# Patient Record
Sex: Female | Born: 1990 | Hispanic: No | Marital: Married | Smoking: Never smoker
Health system: Southern US, Community
[De-identification: ages and names within clinical notes are randomized; demographics above are authoritative.]

## PROBLEM LIST (undated history)

## (undated) ENCOUNTER — Inpatient Hospital Stay (HOSPITAL_COMMUNITY): Payer: Self-pay

## (undated) DIAGNOSIS — Z789 Other specified health status: Secondary | ICD-10-CM

## (undated) HISTORY — PX: NO PAST SURGERIES: SHX2092

---

## 2013-11-16 ENCOUNTER — Ambulatory Visit (INDEPENDENT_AMBULATORY_CARE_PROVIDER_SITE_OTHER): Payer: Self-pay

## 2013-11-16 DIAGNOSIS — Z3401 Encounter for supervision of normal first pregnancy, first trimester: Secondary | ICD-10-CM

## 2013-11-16 DIAGNOSIS — Z3201 Encounter for pregnancy test, result positive: Secondary | ICD-10-CM

## 2013-11-16 LAB — POCT PREGNANCY, URINE: Preg Test, Ur: POSITIVE — AB

## 2013-11-16 NOTE — Addendum Note (Signed)
Addended by: Franchot Mimes on: 11/16/2013 04:42 PM   Modules accepted: Orders

## 2013-11-17 LAB — OBSTETRIC PANEL
Antibody Screen: NEGATIVE
Basophils Absolute: 0 10*3/uL (ref 0.0–0.1)
Basophils Relative: 0 % (ref 0–1)
Eosinophils Relative: 1 % (ref 0–5)
HCT: 38.3 % (ref 36.0–46.0)
Hemoglobin: 13.2 g/dL (ref 12.0–15.0)
Hepatitis B Surface Ag: NEGATIVE
Lymphs Abs: 1.7 10*3/uL (ref 0.7–4.0)
MCHC: 34.5 g/dL (ref 30.0–36.0)
MCV: 79.8 fL (ref 78.0–100.0)
Monocytes Absolute: 0.5 10*3/uL (ref 0.1–1.0)
Monocytes Relative: 5 % (ref 3–12)
Neutro Abs: 7.1 10*3/uL (ref 1.7–7.7)
RDW: 13.8 % (ref 11.5–15.5)
Rh Type: POSITIVE
Rubella: 11.3 Index — ABNORMAL HIGH (ref <0.90)

## 2013-11-17 LAB — HIV ANTIBODY (ROUTINE TESTING W REFLEX): HIV: NONREACTIVE

## 2013-11-18 LAB — HEMOGLOBINOPATHY EVALUATION
Hemoglobin Other: 0 %
Hgb A2 Quant: 2.8 % (ref 2.2–3.2)

## 2013-12-03 NOTE — L&D Delivery Note (Signed)
Delivery Note At 7:23 PM a viable female was delivered via Vaginal, Vacuum (Extractor) (Presentation: Direct OA  ).  APGAR: 7, 9; weight .   Placenta status: Intact, Spontaneous.  Cord: 3 vessels with the following complications: None.   Anesthesia: Epidural  Episiotomy: None Lacerations: Perineal;2nd degree Suture Repair: 3.0 vicryl Est. Blood Loss (mL): 650  Mom to postpartum.  Baby to Nursery.  Samantha Ortiz, Samantha Ortiz 07/20/2014, 8:41 PM

## 2013-12-09 ENCOUNTER — Encounter: Payer: Self-pay | Admitting: Advanced Practice Midwife

## 2013-12-09 ENCOUNTER — Ambulatory Visit (INDEPENDENT_AMBULATORY_CARE_PROVIDER_SITE_OTHER): Payer: Self-pay | Admitting: Advanced Practice Midwife

## 2013-12-09 VITALS — BP 127/81 | Temp 97.2°F | Ht 68.0 in | Wt 146.7 lb

## 2013-12-09 DIAGNOSIS — Z3400 Encounter for supervision of normal first pregnancy, unspecified trimester: Secondary | ICD-10-CM | POA: Insufficient documentation

## 2013-12-09 LAB — POCT URINALYSIS DIP (DEVICE)
Bilirubin Urine: NEGATIVE
Glucose, UA: NEGATIVE mg/dL
Ketones, ur: NEGATIVE mg/dL
NITRITE: NEGATIVE
PH: 7 (ref 5.0–8.0)
PROTEIN: NEGATIVE mg/dL
Specific Gravity, Urine: 1.02 (ref 1.005–1.030)
UROBILINOGEN UA: 1 mg/dL (ref 0.0–1.0)

## 2013-12-09 NOTE — Progress Notes (Signed)
Pulse-  95 

## 2013-12-09 NOTE — Progress Notes (Signed)
   Subjective:    Samantha Ortiz is a G1P0000 4682w1d being seen today for her first obstetrical visit.  Her obstetrical history is significant for none. Patient does intend to breast feed. Pregnancy history fully reviewed.  Patient reports nausea and this is well managed with diet, without medications.Ceasar Mons.  Filed Vitals:   12/09/13 1028 12/09/13 1032  BP: 127/81   Temp: 97.2 F (36.2 C)   Height:  5\' 8"  (1.727 m)  Weight: 146 lb 11.2 oz (66.543 kg)     HISTORY: OB History  Gravida Para Term Preterm AB SAB TAB Ectopic Multiple Living  1 0 0 0 0 0 0 0 0 0     # Outcome Date GA Lbr Len/2nd Weight Sex Delivery Anes PTL Lv  1 CUR              History reviewed. No pertinent past medical history. History reviewed. No pertinent past surgical history. History reviewed. No pertinent family history.   Exam    Uterus:   ~9 week size  Pelvic Exam:    Perineum: No Hemorrhoids, Normal Perineum   Vulva: normal   Vagina:  normal mucosa, normal discharge   pH:    Cervix: no bleeding following Pap, no cervical motion tenderness, no lesions and nulliparous appearance   Adnexa: normal adnexa and no mass, fullness, tenderness   Bony Pelvis: average  System: Breast:  normal appearance, no masses or tenderness   Skin: normal coloration and turgor, no rashes    Neurologic: oriented, normal, gait normal; reflexes normal and symmetric   Extremities: normal strength, tone, and muscle mass   HEENT neck supple with midline trachea and thyroid without masses   Mouth/Teeth mucous membranes moist, pharynx normal without lesions   Neck supple and no masses   Cardiovascular: regular rate and rhythm   Respiratory:  appears well, vitals normal, no respiratory distress, acyanotic, normal RR, ear and throat exam is normal, neck free of mass or lymphadenopathy, chest clear, no wheezing, crepitations, rhonchi, normal symmetric air entry   Abdomen: soft, non-tender; bowel sounds normal; no masses,  no organomegaly    Urinary: urethral meatus normal      Assessment:    Pregnancy: G1P0000 Patient Active Problem List   Diagnosis Date Noted  . Supervision of normal first pregnancy 12/09/2013        Plan:     Initial labs drawn. Prenatal vitamins. Problem list reviewed and updated. Genetic Screening discussed First Screen and Quad Screen: declined.  Ultrasound discussed; fetal survey: requested.  Follow up in 3 weeks.  50% of 30 min visit spent on counseling and coordination of care.     LEFTWICH-KIRBY, Laquinta Hazell 12/09/2013

## 2013-12-09 NOTE — Patient Instructions (Signed)
Prenatal vitamins should have at least 600 mcg Folic Acid and 27 mg iron.

## 2013-12-10 LAB — PRESCRIPTION MONITORING PROFILE (19 PANEL)
AMPHETAMINE/METH: NEGATIVE ng/mL
BARBITURATE SCREEN, URINE: NEGATIVE ng/mL
BENZODIAZEPINE SCREEN, URINE: NEGATIVE ng/mL
BUPRENORPHINE, URINE: NEGATIVE ng/mL
Cannabinoid Scrn, Ur: NEGATIVE ng/mL
Carisoprodol, Urine: NEGATIVE ng/mL
Cocaine Metabolites: NEGATIVE ng/mL
Creatinine, Urine: 134.94 mg/dL (ref >20.0)
Fentanyl, Ur: NEGATIVE ng/mL
MDMA URINE: NEGATIVE ng/mL
MEPERIDINE UR: NEGATIVE ng/mL
METHAQUALONE SCREEN (URINE): NEGATIVE ng/mL
Methadone Screen, Urine: NEGATIVE ng/mL
NITRITES URINE, INITIAL: NEGATIVE ug/mL
Opiate Screen, Urine: NEGATIVE ng/mL
Oxycodone Screen, Ur: NEGATIVE ng/mL
PH URINE, INITIAL: 7 pH (ref 4.5–8.9)
PROPOXYPHENE: NEGATIVE ng/mL
Phencyclidine, Ur: NEGATIVE ng/mL
TAPENTADOLUR: NEGATIVE ng/mL
TRAMADOL UR: NEGATIVE ng/mL
ZOLPIDEM, URINE: NEGATIVE ng/mL

## 2013-12-11 LAB — CULTURE, OB URINE

## 2013-12-30 ENCOUNTER — Ambulatory Visit (INDEPENDENT_AMBULATORY_CARE_PROVIDER_SITE_OTHER): Payer: Self-pay | Admitting: Advanced Practice Midwife

## 2013-12-30 VITALS — BP 110/76 | Temp 96.9°F | Wt 148.5 lb

## 2013-12-30 DIAGNOSIS — Z3400 Encounter for supervision of normal first pregnancy, unspecified trimester: Secondary | ICD-10-CM

## 2013-12-30 LAB — POCT URINALYSIS DIP (DEVICE)
BILIRUBIN URINE: NEGATIVE
GLUCOSE, UA: NEGATIVE mg/dL
Hgb urine dipstick: NEGATIVE
Ketones, ur: NEGATIVE mg/dL
NITRITE: NEGATIVE
Protein, ur: NEGATIVE mg/dL
Specific Gravity, Urine: 1.015 (ref 1.005–1.030)
Urobilinogen, UA: 0.2 mg/dL (ref 0.0–1.0)
pH: 7 (ref 5.0–8.0)

## 2013-12-30 NOTE — Progress Notes (Signed)
Reviewed labs. No complaints. Declined genetic screening. Reviewed schedule of care. Encouraged breastfeeding.

## 2013-12-30 NOTE — Patient Instructions (Signed)
[email or web address]  Pregnancy - First Trimester During sexual intercourse, millions of sperm go into the vagina. Only 1 sperm will penetrate and fertilize the female egg while it is in the Fallopian tube. One week later, the fertilized egg implants into the wall of the uterus. An embryo begins to develop into a baby. At 6 to 8 weeks, the eyes and face are formed and the heartbeat can be seen on ultrasound. At the end of 12 weeks (first trimester), all the baby's organs are formed. Now that you are pregnant, you will want to do everything you can to have a healthy baby. Two of the most important things are to get good prenatal care and follow your caregiver's instructions. Prenatal care is all the medical care you receive before the baby's birth. It is given to prevent, find, and treat problems during the pregnancy and childbirth. PRENATAL EXAMS  During prenatal visits, your weight, blood pressure, and urine are checked. This is done to make sure you are healthy and progressing normally during the pregnancy.  A pregnant woman should gain 25 to 35 pounds during the pregnancy. However, if you are overweight or underweight, your caregiver will advise you regarding your weight.  Your caregiver will ask and answer questions for you.  Blood work, cervical cultures, other necessary tests, and a Pap test are done during your prenatal exams. These tests are done to check on your health and the probable health of your baby. Tests are strongly recommended and done for HIV with your permission. This is the virus that causes AIDS. These tests are done because medicines can be given to help prevent your baby from being born with this infection should you have been infected without knowing it. Blood work is also used to find out your blood type, previous infections, and follow your blood levels (hemoglobin).  Low hemoglobin (anemia) is common during pregnancy. Iron and vitamins are given to help prevent this. Later in  the pregnancy, blood tests for diabetes will be done along with any other tests if any problems develop.  You may need other tests to make sure you and the baby are doing well. CHANGES DURING THE FIRST TRIMESTER  Your body goes through many changes during pregnancy. They vary from person to person. Talk to your caregiver about changes you notice and are concerned about. Changes can include:  Your menstrual period stops.  The egg and sperm carry the genes that determine what you look like. Genes from you and your partner are forming a baby. The female genes determine whether the baby is a boy or a girl.  Your body increases in girth and you may feel bloated.  Feeling sick to your stomach (nauseous) and throwing up (vomiting). If the vomiting is uncontrollable, call your caregiver.  Your breasts will begin to enlarge and become tender.  Your nipples may stick out more and become darker.  The need to urinate more. Painful urination may mean you have a bladder infection.  Tiring easily.  Loss of appetite.  Cravings for certain kinds of food.  At first, you may gain or lose a couple of pounds.  You may have changes in your emotions from day to day (excited to be pregnant or concerned something may go wrong with the pregnancy and baby).  You may have more vivid and strange dreams. HOME CARE INSTRUCTIONS   It is very important to avoid all smoking, alcohol and non-prescribed drugs during your pregnancy. These affect the formation and growth of  the baby. Avoid chemicals while pregnant to ensure the delivery of a healthy infant.  Start your prenatal visits by the 12th week of pregnancy. They are usually scheduled monthly at first, then more often in the last 2 months before delivery. Keep your caregiver's appointments. Follow your caregiver's instructions regarding medicine use, blood and lab tests, exercise, and diet.  During pregnancy, you are providing food for you and your baby. Eat  regular, well-balanced meals. Choose foods such as meat, fish, milk and other low fat dairy products, vegetables, fruits, and whole-grain breads and cereals. Your caregiver will tell you of the ideal weight gain.  You can help morning sickness by keeping soda crackers at the bedside. Eat a couple before arising in the morning. You may want to use the crackers without salt on them.  Eating 4 to 5 small meals rather than 3 large meals a day also may help the nausea and vomiting.  Drinking liquids between meals instead of during meals also seems to help nausea and vomiting.  A physical sexual relationship may be continued throughout pregnancy if there are no other problems. Problems may be early (premature) leaking of amniotic fluid from the membranes, vaginal bleeding, or belly (abdominal) pain.  Exercise regularly if there are no restrictions. Check with your caregiver or physical therapist if you are unsure of the safety of some of your exercises. Greater weight gain will occur in the last 2 trimesters of pregnancy. Exercising will help:  Control your weight.  Keep you in shape.  Prepare you for labor and delivery.  Help you lose your pregnancy weight after you deliver your baby.  Wear a good support or jogging bra for breast tenderness during pregnancy. This may help if worn during sleep too.  Ask when prenatal classes are available. Begin classes when they are offered.  Do not use hot tubs, steam rooms, or saunas.  Wear your seat belt when driving. This protects you and your baby if you are in an accident.  Avoid raw meat, uncooked cheese, cat litter boxes, and soil used by cats throughout the pregnancy. These carry germs that can cause birth defects in the baby.  The first trimester is a good time to visit your dentist for your dental health. Getting your teeth cleaned is okay. Use a softer toothbrush and brush gently during pregnancy.  Ask for help if you have financial,  counseling, or nutritional needs during pregnancy. Your caregiver will be able to offer counseling for these needs as well as refer you for other special needs.  Do not take any medicines or herbs unless told by your caregiver.  Inform your caregiver if there is any mental or physical domestic violence.  Make a list of emergency phone numbers of family, friends, hospital, and police and fire departments.  Write down your questions. Take them to your prenatal visit.  Do not douche.  Do not cross your legs.  If you have to stand for long periods of time, rotate you feet or take small steps in a circle.  You may have more vaginal secretions that may require a sanitary pad. Do not use tampons or scented sanitary pads. MEDICINES AND DRUG USE IN PREGNANCY  Take prenatal vitamins as directed. The vitamin should contain 1 milligram of folic acid. Keep all vitamins out of reach of children. Only a couple vitamins or tablets containing iron may be fatal to a baby or young child when ingested.  Avoid use of all medicines, including herbs, over-the-counter  medicines, not prescribed or suggested by your caregiver. Only take over-the-counter or prescription medicines for pain, discomfort, or fever as directed by your caregiver. Do not use aspirin, ibuprofen, or naproxen unless directed by your caregiver.  Let your caregiver also know about herbs you may be using.  Alcohol is related to a number of birth defects. This includes fetal alcohol syndrome. All alcohol, in any form, should be avoided completely. Smoking will cause low birth rate and premature babies.  Street or illegal drugs are very harmful to the baby. They are absolutely forbidden. A baby born to an addicted mother will be addicted at birth. The baby will go through the same withdrawal an adult does.  Let your caregiver know about any medicines that you have to take and for what reason you take them. SEEK MEDICAL CARE IF:  You have any  concerns or worries during your pregnancy. It is better to call with your questions if you feel they cannot wait, rather than worry about them. SEEK IMMEDIATE MEDICAL CARE IF:   An unexplained oral temperature above 102 F (38.9 C) develops, or as your caregiver suggests.  You have leaking of fluid from the vagina (birth canal). If leaking membranes are suspected, take your temperature and inform your caregiver of this when you call.  There is vaginal spotting or bleeding. Notify your caregiver of the amount and how many pads are used.  You develop a bad smelling vaginal discharge with a change in the color.  You continue to feel sick to your stomach (nauseated) and have no relief from remedies suggested. You vomit blood or coffee ground-like materials.  You lose more than 2 pounds of weight in 1 week.  You gain more than 2 pounds of weight in 1 week and you notice swelling of your face, hands, feet, or legs.  You gain 5 pounds or more in 1 week (even if you do not have swelling of your hands, face, legs, or feet).  You get exposed to [place] measles and have never had them.  You are exposed to fifth disease or chickenpox.  You develop belly (abdominal) pain. Round ligament discomfort is a common non-cancerous (benign) cause of abdominal pain in pregnancy. Your caregiver still must evaluate this.  You develop headache, fever, diarrhea, pain with urination, or shortness of breath.  You fall or are in a car accident or have any kind of trauma.  There is mental or physical violence in your home. Document Released: 11/13/2001 Document Revised: 08/13/2012 Document Reviewed: 05/17/2009 [facility] Patient Information 2014 [place], [place].  Breastfeeding Deciding to breastfeed is one of the best choices you can make for you and your baby. A change in hormones during pregnancy causes your breast tissue to grow and increases the number and size of your milk ducts. These hormones also allow  proteins, sugars, and fats from your blood supply to make breast milk in your milk-producing glands. Hormones prevent breast milk from being released before your baby is born as well as prompt milk flow after birth. Once breastfeeding has begun, thoughts of your baby, as well as his or her sucking or crying, can stimulate the release of milk from your milk-producing glands.  BENEFITS OF BREASTFEEDING For Your Baby  Your first milk (colostrum) helps your baby's digestive system function better.   There are antibodies in your milk that help your baby fight off infections.   Your baby has a lower incidence of asthma, allergies, and sudden infant death syndrome.  The nutrients in breast milk are better for your baby than infant formulas and are designed uniquely for your baby's needs.   Breast milk improves your baby's brain development.   Your baby is less likely to develop other conditions, such as childhood obesity, asthma, or type 2 diabetes mellitus.  For You   Breastfeeding helps to create a very special bond between you and your baby.   Breastfeeding is convenient. Breast milk is always available at the correct temperature and costs nothing.   Breastfeeding helps to burn calories and helps you lose the weight gained during pregnancy.   Breastfeeding makes your uterus contract to its prepregnancy size faster and slows bleeding (lochia) after you give birth.   Breastfeeding helps to lower your risk of developing type 2 diabetes mellitus, osteoporosis, and breast or ovarian cancer later in life. SIGNS THAT YOUR BABY IS HUNGRY Early Signs of Hunger  Increased alertness or activity.  Stretching.  Movement of the head from side to side.  Movement of the head and opening of the mouth when the corner of the mouth or cheek is stroked (rooting).  Increased sucking sounds, smacking lips, cooing, sighing, or squeaking.  Hand-to-mouth movements.  Increased sucking of  fingers or hands. Late Signs of Hunger  Fussing.  Intermittent crying. Extreme Signs of Hunger Signs of extreme hunger will require calming and consoling before your baby will be able to breastfeed successfully. Do not wait for the following signs of extreme hunger to occur before you initiate breastfeeding:   Restlessness.  A loud, strong cry.   Screaming. BREASTFEEDING BASICS Breastfeeding Initiation  Find a comfortable place to sit or lie down, with your neck and back well supported.  Place a pillow or rolled up blanket under your baby to bring him or her to the level of your breast (if you are seated). Nursing pillows are specially designed to help support your arms and your baby while you breastfeed.  Make sure that your baby's abdomen is facing your abdomen.   Gently massage your breast. With your fingertips, massage from your chest wall toward your nipple in a circular motion. This encourages milk flow. You may need to continue this action during the feeding if your milk flows slowly.  Support your breast with 4 fingers underneath and your thumb above your nipple. Make sure your fingers are well away from your nipple and your baby's mouth.   Stroke your baby's lips gently with your finger or nipple.   When your baby's mouth is open wide enough, quickly bring your baby to your breast, placing your entire nipple and as much of the colored area around your nipple (areola) as possible into your baby's mouth.   More areola should be visible above your baby's upper lip than below the lower lip.   Your baby's tongue should be between his or her lower gum and your breast.   Ensure that your baby's mouth is correctly positioned around your nipple (latched). Your baby's lips should create a seal on your breast and be turned out (everted).  It is common for your baby to suck about 2 3 minutes in order to start the flow of breast milk. Latching Teaching your baby how to latch  on to your breast properly is very important. An improper latch can cause nipple pain and decreased milk supply for you and poor weight gain in your baby. Also, if your baby is not latched onto your nipple properly, he or she may swallow some  air during feeding. This can make your baby fussy. Burping your baby when you switch breasts during the feeding can help to get rid of the air. However, teaching your baby to latch on properly is still the best way to prevent fussiness from swallowing air while breastfeeding. Signs that your baby has successfully latched on to your nipple:    Silent tugging or silent sucking, without causing you pain.   Swallowing heard between every 3 4 sucks.    Muscle movement above and in front of his or her ears while sucking.  Signs that your baby has not successfully latched on to nipple:   Sucking sounds or smacking sounds from your baby while breastfeeding.  Nipple pain. If you think your baby has not latched on correctly, slip your finger into the corner of your baby's mouth to break the suction and place it between your baby's gums. Attempt breastfeeding initiation again. Signs of Successful Breastfeeding Signs from your baby:   A gradual decrease in the number of sucks or complete cessation of sucking.   Falling asleep.   Relaxation of his or her body.   Retention of a small amount of milk in his or her mouth.   Letting go of your breast by himself or herself. Signs from you:  Breasts that have increased in firmness, weight, and size 1 3 hours after feeding.   Breasts that are softer immediately after breastfeeding.  Increased milk volume, as well as a change in milk consistency and color by the 5th day of breastfeeding.   Nipples that are not sore, cracked, or bleeding. Signs That Your Pecola Leisure is Getting Enough Milk  Wetting at least 3 diapers in a 24-hour period. The urine should be clear and pale yellow by age 27 days.  At least 3  stools in a 24-hour period by age 27 days. The stool should be soft and yellow.  At least 3 stools in a 24-hour period by age 85 days. The stool should be seedy and yellow.  No loss of weight greater than 10% of birth weight during the first 65 days of age.  Average weight gain of 4 7 ounces (120 210 mL) per week after age 35 days.  Consistent daily weight gain by age 27 days, without weight loss after the age of 2 weeks. After a feeding, your baby may spit up a small amount. This is common. BREASTFEEDING FREQUENCY AND DURATION Frequent feeding will help you make more milk and can prevent sore nipples and breast engorgement. Breastfeed when you feel the need to reduce the fullness of your breasts or when your baby shows signs of hunger. This is called "breastfeeding on demand." Avoid introducing a pacifier to your baby while you are working to establish breastfeeding (the first 4 6 weeks after your baby is born). After this time you may choose to use a pacifier. Research has shown that pacifier use during the first year of a baby's life decreases the risk of sudden infant death syndrome (SIDS). Allow your baby to feed on each breast as long as he or she wants. Breastfeed until your baby is finished feeding. When your baby unlatches or falls asleep while feeding from the first breast, offer the second breast. Because newborns are often sleepy in the first few weeks of life, you may need to awaken your baby to get him or her to feed. Breastfeeding times will vary from baby to baby. However, the following rules can serve as a guide to  help you ensure that your baby is properly fed:  Newborns (babies 32 weeks of age or younger) may breastfeed every 1 3 hours.  Newborns should not go longer than 3 hours during the day or 5 hours during the night without breastfeeding.  You should breastfeed your baby a minimum of 8 times in a 24-hour period until you begin to introduce solid foods to your baby at around 31  months of age. BREAST MILK PUMPING Pumping and storing breast milk allows you to ensure that your baby is exclusively fed your breast milk, even at times when you are unable to breastfeed. This is especially important if you are going back to work while you are still breastfeeding or when you are not able to be present during feedings. Your lactation consultant can give you guidelines on how long it is safe to store breast milk.  A breast pump is a machine that allows you to pump milk from your breast into a sterile bottle. The pumped breast milk can then be stored in a refrigerator or freezer. Some breast pumps are operated by hand, while others use electricity. Ask your lactation consultant which type will work best for you. Breast pumps can be purchased, but some hospitals and breastfeeding support groups lease breast pumps on a monthly basis. A lactation consultant can teach you how to hand express breast milk, if you prefer not to use a pump.  CARING FOR YOUR BREASTS WHILE YOU BREASTFEED Nipples can become dry, cracked, and sore while breastfeeding. The following recommendations can help keep your breasts moisturized and healthy:  Avoid using soap on your nipples.   Wear a supportive bra. Although not required, special nursing bras and tank tops are designed to allow access to your breasts for breastfeeding without taking off your entire bra or top. Avoid wearing underwire style bras or extremely tight bras.  Air dry your nipples for 3 after each feeding.   Use only cotton bra pads to absorb leaked breast milk. Leaking of breast milk between feedings is normal.   Use lanolin on your nipples after breastfeeding. Lanolin helps to maintain your skin's normal moisture barrier. If you use pure lanolin you do not need to wash it off before feeding your baby again. Pure lanolin is not toxic to your baby. You may also hand express a few drops of breast milk and gently massage that milk into  your nipples and allow the milk to air dry. In the first few weeks after giving birth, some women experience extremely full breasts (engorgement). Engorgement can make your breasts feel heavy, warm, and tender to the touch. Engorgement peaks within 3 5 days after you give birth. The following recommendations can help ease engorgement:  Completely empty your breasts while breastfeeding or pumping. You may want to start by applying warm, moist heat (in the shower or with warm water-soaked hand towels) just before feeding or pumping. This increases circulation and helps the milk flow. If your baby does not completely empty your breasts while breastfeeding, pump any extra milk after he or she is finished.  Wear a snug bra (nursing or regular) or tank top for 1 2 days to signal your body to slightly decrease milk production.  Apply ice packs to your breasts, unless this is too uncomfortable for you.  Make sure that your baby is latched on and positioned properly while breastfeeding. If engorgement persists after 48 hours of following these recommendations, contact your health care provider or a lactation  Research scientist (medical). OVERALL HEALTH CARE RECOMMENDATIONS WHILE BREASTFEEDING  Eat healthy foods. Alternate between meals and snacks, eating 3 of each per day. Because what you eat affects your breast milk, some of the foods may make your baby more irritable than usual. Avoid eating these foods if you are sure that they are negatively affecting your baby.  Drink milk, fruit juice, and water to satisfy your thirst (about 10 glasses a day).   Rest often, relax, and continue to take your prenatal vitamins to prevent fatigue, stress, and anemia.  Continue breast self-awareness checks.  Avoid chewing and smoking tobacco.  Avoid alcohol and drug use. Some medicines that may be harmful to your baby can pass through breast milk. It is important to ask your health care provider before taking any medicine, including  all over-the-counter and prescription medicine as well as vitamin and herbal supplements. It is possible to become pregnant while breastfeeding. If birth control is desired, ask your health care provider about options that will be safe for your baby. SEEK MEDICAL CARE IF:   You feel like you want to stop breastfeeding or have become frustrated with breastfeeding.  You have painful breasts or nipples.  Your nipples are cracked or bleeding.  Your breasts are red, tender, or warm.  You have a swollen area on either breast.  You have a fever or chills.  You have nausea or vomiting.  You have drainage other than breast milk from your nipples.  Your breasts do not become full before feedings by the 5th day after you give birth.  You feel sad and depressed.  Your baby is too sleepy to eat well.  Your baby is having trouble sleeping.   Your baby is wetting less than 3 diapers in a 24-hour period.  Your baby has less than 3 stools in a 24-hour period.  Your baby's skin or the white part of his or her eyes becomes yellow.   Your baby is not gaining weight by 74 days of age. SEEK IMMEDIATE MEDICAL CARE IF:   Your baby is overly tired (lethargic) and does not want to wake up and feed.  Your baby develops an unexplained fever. Document Released: 11/19/2005 Document Revised: 07/22/2013 Document Reviewed: 05/13/2013 [facility] Patient Information 2014 [place], [place].

## 2013-12-30 NOTE — Progress Notes (Signed)
Pulse: 92

## 2014-01-29 ENCOUNTER — Encounter: Payer: Self-pay | Admitting: Obstetrics and Gynecology

## 2014-02-16 ENCOUNTER — Encounter: Payer: Self-pay | Admitting: Family Medicine

## 2014-02-16 ENCOUNTER — Ambulatory Visit (INDEPENDENT_AMBULATORY_CARE_PROVIDER_SITE_OTHER): Payer: Self-pay | Admitting: Family Medicine

## 2014-02-16 VITALS — BP 133/86 | Temp 97.1°F | Wt 157.4 lb

## 2014-02-16 DIAGNOSIS — Z3400 Encounter for supervision of normal first pregnancy, unspecified trimester: Secondary | ICD-10-CM

## 2014-02-16 LAB — POCT URINALYSIS DIP (DEVICE)
Bilirubin Urine: NEGATIVE
Glucose, UA: NEGATIVE mg/dL
Hgb urine dipstick: NEGATIVE
Ketones, ur: NEGATIVE mg/dL
Nitrite: NEGATIVE
Protein, ur: NEGATIVE mg/dL
Specific Gravity, Urine: >=1.03 (ref 1.005–1.030)
Urobilinogen, UA: 0.2 mg/dL (ref 0.0–1.0)
pH: 5.5 (ref 5.0–8.0)

## 2014-02-16 NOTE — Patient Instructions (Signed)
Second Trimester of Pregnancy The second trimester is from week 13 through week 28, months 4 through 6. The second trimester is often a time when you feel your best. Your body has also adjusted to being pregnant, and you begin to feel better physically. Usually, morning sickness has lessened or quit completely, you may have more energy, and you may have an increase in appetite. The second trimester is also a time when the fetus is growing rapidly. At the end of the sixth month, the fetus is about 9 inches long and weighs about 1 pounds. You will likely begin to feel the baby move (quickening) between 18 and 20 weeks of the pregnancy. BODY CHANGES Your body goes through many changes during pregnancy. The changes vary from woman to woman.   Your weight will continue to increase. You will notice your lower abdomen bulging out.  You may begin to get stretch marks on your hips, abdomen, and breasts.  You may develop headaches that can be relieved by medicines approved by your caregiver.  You may urinate more often because the fetus is pressing on your bladder.  You may develop or continue to have heartburn as a result of your pregnancy.  You may develop constipation because certain hormones are causing the muscles that push waste through your intestines to slow down.  You may develop hemorrhoids or swollen, bulging veins (varicose veins).  You may have back pain because of the weight gain and pregnancy hormones relaxing your joints between the bones in your pelvis and as a result of a shift in weight and the muscles that support your balance.  Your breasts will continue to grow and be tender.  Your gums may bleed and may be sensitive to brushing and flossing.  Dark spots or blotches (chloasma, mask of pregnancy) may develop on your face. This will likely fade after the baby is born.  A dark line from your belly button to the pubic area (linea nigra) may appear. This will likely fade after the  baby is born. WHAT TO EXPECT AT YOUR PRENATAL VISITS During a routine prenatal visit:  You will be weighed to make sure you and the fetus are growing normally.  Your blood pressure will be taken.  Your abdomen will be measured to track your baby's growth.  The fetal heartbeat will be listened to.  Any test results from the previous visit will be discussed. Your caregiver may ask you:  How you are feeling.  If you are feeling the baby move.  If you have had any abnormal symptoms, such as leaking fluid, bleeding, severe headaches, or abdominal cramping.  If you have any questions. Other tests that may be performed during your second trimester include:  Blood tests that check for:  Low iron levels (anemia).  Gestational diabetes (between 24 and 28 weeks).  Rh antibodies.  Urine tests to check for infections, diabetes, or protein in the urine.  An ultrasound to confirm the proper growth and development of the baby.  An amniocentesis to check for possible genetic problems.  Fetal screens for spina bifida and Down syndrome. HOME CARE INSTRUCTIONS   Avoid all smoking, herbs, alcohol, and unprescribed drugs. These chemicals affect the formation and growth of the baby.  Follow your caregiver's instructions regarding medicine use. There are medicines that are either safe or unsafe to take during pregnancy.  Exercise only as directed by your caregiver. Experiencing uterine cramps is a good sign to stop exercising.  Continue to eat regular,   healthy meals.  Wear a good support bra for breast tenderness.  Do not use hot tubs, steam rooms, or saunas.  Wear your seat belt at all times when driving.  Avoid raw meat, uncooked cheese, cat litter boxes, and soil used by cats. These carry germs that can cause birth defects in the baby.  Take your prenatal vitamins.  Try taking a stool softener (if your caregiver approves) if you develop constipation. Eat more high-fiber foods,  such as fresh vegetables or fruit and whole grains. Drink plenty of fluids to keep your urine clear or pale yellow.  Take warm sitz baths to soothe any pain or discomfort caused by hemorrhoids. Use hemorrhoid cream if your caregiver approves.  If you develop varicose veins, wear support hose. Elevate your feet for 15 minutes, 3 4 times a day. Limit salt in your diet.  Avoid heavy lifting, wear low heel shoes, and practice good posture.  Rest with your legs elevated if you have leg cramps or low back pain.  Visit your dentist if you have not gone yet during your pregnancy. Use a soft toothbrush to brush your teeth and be gentle when you floss.  A sexual relationship may be continued unless your caregiver directs you otherwise.  Continue to go to all your prenatal visits as directed by your caregiver. SEEK MEDICAL CARE IF:   You have dizziness.  You have mild pelvic cramps, pelvic pressure, or nagging pain in the abdominal area.  You have persistent nausea, vomiting, or diarrhea.  You have a bad smelling vaginal discharge.  You have pain with urination. SEEK IMMEDIATE MEDICAL CARE IF:   You have a fever.  You are leaking fluid from your vagina.  You have spotting or bleeding from your vagina.  You have severe abdominal cramping or pain.  You have rapid weight gain or loss.  You have shortness of breath with chest pain.  You notice sudden or extreme swelling of your face, hands, ankles, feet, or legs.  You have not felt your baby move in over an hour.  You have severe headaches that do not go away with medicine.  You have vision changes. Document Released: 11/13/2001 Document Revised: 07/22/2013 Document Reviewed: 01/20/2013 [employer] Patient Information 2014 [employer].  

## 2014-02-16 NOTE — Progress Notes (Signed)
P= 100 Medicaid enrollment number/information card given to pt. Advised pt. To call number to see if she can get coverage started.

## 2014-02-16 NOTE — Progress Notes (Signed)
S: 23 yo G1 @ [id number] here for ROBV - doing well - tired some  - no concerns.   O: see flowsheet  A/P - anatomy US scheduled today - bleeding precautions discussed - f/u in 4 weeks - quad declined.

## 2014-02-23 ENCOUNTER — Ambulatory Visit (HOSPITAL_COMMUNITY)
Admission: RE | Admit: 2014-02-23 | Discharge: 2014-02-23 | Disposition: A | Discharge status: Home / Self Care | Payer: Medicaid Other | Source: Ambulatory Visit | Attending: Family Medicine | Admitting: Family Medicine

## 2014-02-23 DIAGNOSIS — Z3400 Encounter for supervision of normal first pregnancy, unspecified trimester: Secondary | ICD-10-CM

## 2014-02-23 DIAGNOSIS — Z3689 Encounter for other specified antenatal screening: Secondary | ICD-10-CM | POA: Diagnosis not present

## 2014-02-24 ENCOUNTER — Encounter: Payer: Self-pay | Admitting: Family Medicine

## 2014-03-16 ENCOUNTER — Ambulatory Visit (INDEPENDENT_AMBULATORY_CARE_PROVIDER_SITE_OTHER): Payer: Medicaid Other | Admitting: Advanced Practice Midwife

## 2014-03-16 VITALS — BP 111/64 | Temp 98.6°F | Wt 161.6 lb

## 2014-03-16 DIAGNOSIS — Z3400 Encounter for supervision of normal first pregnancy, unspecified trimester: Secondary | ICD-10-CM

## 2014-03-16 LAB — POCT URINALYSIS DIP (DEVICE)
Bilirubin Urine: NEGATIVE
GLUCOSE, UA: NEGATIVE mg/dL
Hgb urine dipstick: NEGATIVE
KETONES UR: NEGATIVE mg/dL
Nitrite: NEGATIVE
Protein, ur: NEGATIVE mg/dL
SPECIFIC GRAVITY, URINE: 1.025 (ref 1.005–1.030)
UROBILINOGEN UA: 0.2 mg/dL (ref 0.0–1.0)
pH: 6.5 (ref 5.0–8.0)

## 2014-03-16 NOTE — Progress Notes (Signed)
Pulse- 92 Patient reports numbness/tingling in her arms also reporting ear pain

## 2014-03-16 NOTE — Progress Notes (Signed)
Doing well.  Good fetal movement, denies vaginal bleeding, LOF, cramping/contractions.  Discussed tingling in leg/arms as pregnancy related, discussed positions to reduce discomfort.  Pt doing yoga when tingling occurred.  Ok to continue exercising. Ear pain/pressure may be related to seasonal allergies, Ok to take antihistamines like Claritin.  Recommend saline nasal spray for allergic congestion.  Discussed risks vs benefits to eye cream for eczema.  Category C but low risk to use small doses as topical medication.

## 2014-03-29 ENCOUNTER — Encounter: Payer: Self-pay | Admitting: *Deleted

## 2014-04-13 ENCOUNTER — Ambulatory Visit (INDEPENDENT_AMBULATORY_CARE_PROVIDER_SITE_OTHER): Payer: Medicaid Other | Admitting: Obstetrics and Gynecology

## 2014-04-13 VITALS — BP 116/73 | HR 98 | Temp 97.6°F | Wt 169.0 lb

## 2014-04-13 DIAGNOSIS — Z23 Encounter for immunization: Secondary | ICD-10-CM

## 2014-04-13 DIAGNOSIS — Z3400 Encounter for supervision of normal first pregnancy, unspecified trimester: Secondary | ICD-10-CM

## 2014-04-13 DIAGNOSIS — Z3490 Encounter for supervision of normal pregnancy, unspecified, unspecified trimester: Secondary | ICD-10-CM

## 2014-04-13 LAB — CBC
HCT: 34.5 % — ABNORMAL LOW (ref 36.0–46.0)
HEMOGLOBIN: 11.8 g/dL — AB (ref 12.0–15.0)
MCH: 28.6 pg (ref 26.0–34.0)
MCHC: 34.2 g/dL (ref 30.0–36.0)
MCV: 83.5 fL (ref 78.0–100.0)
Platelets: 171 10*3/uL (ref 150–400)
RBC: 4.13 MIL/uL (ref 3.87–5.11)
RDW: 13.3 % (ref 11.5–15.5)
WBC: 13.1 10*3/uL — AB (ref 4.0–10.5)

## 2014-04-13 LAB — POCT URINALYSIS DIP (DEVICE)
BILIRUBIN URINE: NEGATIVE
GLUCOSE, UA: NEGATIVE mg/dL
Hgb urine dipstick: NEGATIVE
Ketones, ur: NEGATIVE mg/dL
NITRITE: NEGATIVE
Protein, ur: NEGATIVE mg/dL
SPECIFIC GRAVITY, URINE: 1.025 (ref 1.005–1.030)
Urobilinogen, UA: 0.2 mg/dL (ref 0.0–1.0)
pH: 5.5 (ref 5.0–8.0)

## 2014-04-13 MED ORDER — TETANUS-DIPHTH-ACELL PERTUSSIS 5-2.5-18.5 LF-MCG/0.5 IM SUSP: 0.5000 mL | Freq: Once | INTRAMUSCULAR | Status: DC

## 2014-04-13 NOTE — Progress Notes (Signed)
Doing well. Will do classes. Knows few people, moved here from [place] 6 mo ago, but has support. 28 wk labs today. "Numb " sensation in left ear canal. No drainage, dizziness, decreased hearing or pain. Does not swim. Has some respiratory allergies but no nose throat sx now. Rt TM: pink, good LR. Marland Kitchen. Left: NT on tragus movement. Canal not inflamed or scaley. TM half occluded by wax and LR not seen, otherwise nl. Advised Auralgan otic gtts prn.

## 2014-04-13 NOTE — Patient Instructions (Signed)
Second Trimester of Pregnancy The second trimester is from week 13 through week 28, months 4 through 6. The second trimester is often a time when you feel your best. Your body has also adjusted to being pregnant, and you begin to feel better physically. Usually, morning sickness has lessened or quit completely, you may have more energy, and you may have an increase in appetite. The second trimester is also a time when the fetus is growing rapidly. At the end of the sixth month, the fetus is about 9 inches long and weighs about 1 pounds. You will likely begin to feel the baby move (quickening) between 18 and 20 weeks of the pregnancy. BODY CHANGES Your body goes through many changes during pregnancy. The changes vary from woman to woman.   Your weight will continue to increase. You will notice your lower abdomen bulging out.  You may begin to get stretch marks on your hips, abdomen, and breasts.  You may develop headaches that can be relieved by medicines approved by your caregiver.  You may urinate more often because the fetus is pressing on your bladder.  You may develop or continue to have heartburn as a result of your pregnancy.  You may develop constipation because certain hormones are causing the muscles that push waste through your intestines to slow down.  You may develop hemorrhoids or swollen, bulging veins (varicose veins).  You may have back pain because of the weight gain and pregnancy hormones relaxing your joints between the bones in your pelvis and as a result of a shift in weight and the muscles that support your balance.  Your breasts will continue to grow and be tender.  Your gums may bleed and may be sensitive to brushing and flossing.  Dark spots or blotches (chloasma, mask of pregnancy) may develop on your face. This will likely fade after the baby is born.  A dark line from your belly button to the pubic area (linea nigra) may appear. This will likely fade after the  baby is born. WHAT TO EXPECT AT YOUR PRENATAL VISITS During a routine prenatal visit:  You will be weighed to make sure you and the fetus are growing normally.  Your blood pressure will be taken.  Your abdomen will be measured to track your baby's growth.  The fetal heartbeat will be listened to.  Any test results from the previous visit will be discussed. Your caregiver may ask you:  How you are feeling.  If you are feeling the baby move.  If you have had any abnormal symptoms, such as leaking fluid, bleeding, severe headaches, or abdominal cramping.  If you have any questions. Other tests that may be performed during your second trimester include:  Blood tests that check for:  Low iron levels (anemia).  Gestational diabetes (between 24 and 28 weeks).  Rh antibodies.  Urine tests to check for infections, diabetes, or protein in the urine.  An ultrasound to confirm the proper growth and development of the baby.  An amniocentesis to check for possible genetic problems.  Fetal screens for spina bifida and Down syndrome. HOME CARE INSTRUCTIONS   Avoid all smoking, herbs, alcohol, and unprescribed drugs. These chemicals affect the formation and growth of the baby.  Follow your caregiver's instructions regarding medicine use. There are medicines that are either safe or unsafe to take during pregnancy.  Exercise only as directed by your caregiver. Experiencing uterine cramps is a good sign to stop exercising.  Continue to eat regular,   healthy meals.  Wear a good support bra for breast tenderness.  Do not use hot tubs, steam rooms, or saunas.  Wear your seat belt at all times when driving.  Avoid raw meat, uncooked cheese, cat litter boxes, and soil used by cats. These carry germs that can cause birth defects in the baby.  Take your prenatal vitamins.  Try taking a stool softener (if your caregiver approves) if you develop constipation. Eat more high-fiber foods,  such as fresh vegetables or fruit and whole grains. Drink plenty of fluids to keep your urine clear or pale yellow.  Take warm sitz baths to soothe any pain or discomfort caused by hemorrhoids. Use hemorrhoid cream if your caregiver approves.  If you develop varicose veins, wear support hose. Elevate your feet for 15 minutes, 3 4 times a day. Limit salt in your diet.  Avoid heavy lifting, wear low heel shoes, and practice good posture.  Rest with your legs elevated if you have leg cramps or low back pain.  Visit your dentist if you have not gone yet during your pregnancy. Use a soft toothbrush to brush your teeth and be gentle when you floss.  A sexual relationship may be continued unless your caregiver directs you otherwise.  Continue to go to all your prenatal visits as directed by your caregiver. SEEK MEDICAL CARE IF:   You have dizziness.  You have mild pelvic cramps, pelvic pressure, or nagging pain in the abdominal area.  You have persistent nausea, vomiting, or diarrhea.  You have a bad smelling vaginal discharge.  You have pain with urination. SEEK IMMEDIATE MEDICAL CARE IF:   You have a fever.  You are leaking fluid from your vagina.  You have spotting or bleeding from your vagina.  You have severe abdominal cramping or pain.  You have rapid weight gain or loss.  You have shortness of breath with chest pain.  You notice sudden or extreme swelling of your face, hands, ankles, feet, or legs.  You have not felt your baby move in over an hour.  You have severe headaches that do not go away with medicine.  You have vision changes. Document Released: 11/13/2001 Document Revised: 07/22/2013 Document Reviewed: 01/20/2013 [employer] Patient Information 2014 [employer].  

## 2014-04-13 NOTE — Progress Notes (Signed)
Pt reports numbness in left ear past 3 weeks.

## 2014-04-14 LAB — HIV ANTIBODY (ROUTINE TESTING W REFLEX): HIV 1&2 Ab, 4th Generation: NONREACTIVE

## 2014-04-14 LAB — GLUCOSE TOLERANCE, 1 HOUR (50G) W/O FASTING: Glucose, 1 Hour GTT: 103 mg/dL (ref 70–140)

## 2014-04-14 LAB — RPR

## 2014-04-27 ENCOUNTER — Ambulatory Visit (INDEPENDENT_AMBULATORY_CARE_PROVIDER_SITE_OTHER): Payer: Medicaid Other | Admitting: Family Medicine

## 2014-04-27 VITALS — BP 121/79 | HR 101 | Temp 97.0°F | Wt 172.0 lb

## 2014-04-27 DIAGNOSIS — Z3400 Encounter for supervision of normal first pregnancy, unspecified trimester: Secondary | ICD-10-CM

## 2014-04-27 DIAGNOSIS — R109 Unspecified abdominal pain: Secondary | ICD-10-CM

## 2014-04-27 LAB — POCT URINALYSIS DIP (DEVICE)
Bilirubin Urine: NEGATIVE
Glucose, UA: NEGATIVE mg/dL
HGB URINE DIPSTICK: NEGATIVE
Ketones, ur: NEGATIVE mg/dL
Nitrite: POSITIVE — AB
PROTEIN: NEGATIVE mg/dL
SPECIFIC GRAVITY, URINE: 1.025 (ref 1.005–1.030)
UROBILINOGEN UA: 0.2 mg/dL (ref 0.0–1.0)
pH: 6 (ref 5.0–8.0)

## 2014-04-27 MED ORDER — NITROFURANTOIN MONOHYD MACRO 100 MG PO CAPS: 100.0000 mg | ORAL_CAPSULE | Freq: Two times a day (BID) | ORAL | Status: DC

## 2014-04-27 NOTE — Progress Notes (Signed)
S: 23 yo G1 @ [redacted]w[redacted]d here for ROBV - has had two episodes of right sided abdominal pain at night when laying in bed at night - now has no pain - also having left ear numbness and tingling  - no ctx, lof, vb. +FM.   O: see flowsheet. abd- soft, nt, palpable muscle spasm on right side  HEENT- bilateral tms normal  A/P 1) ear numbness - improving without intervention. Normal TM's. No hearing loss. Cont expectant management for now  2) abd pain - on exam and hx most consistent with muscle spasm (positional, sharp, acute and short lived) - ua with leuks and nitrites. Will send for cx but also treat with macrobid  3) PTL precautions discussed. F/u in 2 weeks.

## 2014-04-27 NOTE — Patient Instructions (Signed)
Third Trimester of Pregnancy  The third trimester is from week 29 through week 42, months 7 through 9. The third trimester is a time when the fetus is growing rapidly. At the end of the ninth month, the fetus is about 20 inches in length and weighs 6 10 pounds.   BODY CHANGES  Your body goes through many changes during pregnancy. The changes vary from woman to woman.    Your weight will continue to increase. You can expect to gain 25 35 pounds (11 16 kg) by the end of the pregnancy.   You may begin to get stretch marks on your hips, abdomen, and breasts.   You may urinate more often because the fetus is moving lower into your pelvis and pressing on your bladder.   You may develop or continue to have heartburn as a result of your pregnancy.   You may develop constipation because certain hormones are causing the muscles that push waste through your intestines to slow down.   You may develop hemorrhoids or swollen, bulging veins (varicose veins).   You may have pelvic pain because of the weight gain and pregnancy hormones relaxing your joints between the bones in your pelvis. Back aches may result from over exertion of the muscles supporting your posture.   Your breasts will continue to grow and be tender. A yellow discharge may leak from your breasts called colostrum.   Your belly button may stick out.   You may feel short of breath because of your expanding uterus.   You may notice the fetus "dropping," or moving lower in your abdomen.   You may have a bloody mucus discharge. This usually occurs a few days to a week before labor begins.   Your cervix becomes thin and soft (effaced) near your due date.  WHAT TO EXPECT AT YOUR PRENATAL EXAMS   You will have prenatal exams every 2 weeks until week 36. Then, you will have weekly prenatal exams. During a routine prenatal visit:   You will be weighed to make sure you and the fetus are growing normally.   Your blood pressure is taken.   Your abdomen will be  measured to track your baby's growth.   The fetal heartbeat will be listened to.   Any test results from the previous visit will be discussed.   You may have a cervical check near your due date to see if you have effaced.  At around 36 weeks, your caregiver will check your cervix. At the same time, your caregiver will also perform a test on the secretions of the vaginal tissue. This test is to determine if a type of bacteria, Group B streptococcus, is present. Your caregiver will explain this further.  Your caregiver may ask you:   What your birth plan is.   How you are feeling.   If you are feeling the baby move.   If you have had any abnormal symptoms, such as leaking fluid, bleeding, severe headaches, or abdominal cramping.   If you have any questions.  Other tests or screenings that may be performed during your third trimester include:   Blood tests that check for low iron levels (anemia).   Fetal testing to check the health, activity level, and growth of the fetus. Testing is done if you have certain medical conditions or if there are problems during the pregnancy.  FALSE LABOR  You may feel small, irregular contractions that eventually go away. These are called Braxton Hicks contractions, or   false labor. Contractions may last for hours, days, or even weeks before true labor sets in. If contractions come at regular intervals, intensify, or become painful, it is best to be seen by your caregiver.   SIGNS OF LABOR    Menstrual-like cramps.   Contractions that are 5 minutes apart or less.   Contractions that start on the top of the uterus and spread down to the lower abdomen and back.   A sense of increased pelvic pressure or back pain.   A watery or bloody mucus discharge that comes from the vagina.  If you have any of these signs before the 37th week of pregnancy, call your caregiver right away. You need to go to the hospital to get checked immediately.  HOME CARE INSTRUCTIONS    Avoid all  smoking, herbs, alcohol, and unprescribed drugs. These chemicals affect the formation and growth of the baby.   Follow your caregiver's instructions regarding medicine use. There are medicines that are either safe or unsafe to take during pregnancy.   Exercise only as directed by your caregiver. Experiencing uterine cramps is a good sign to stop exercising.   Continue to eat regular, healthy meals.   Wear a good support bra for breast tenderness.   Do not use hot tubs, steam rooms, or saunas.   Wear your seat belt at all times when driving.   Avoid raw meat, uncooked cheese, cat litter boxes, and soil used by cats. These carry germs that can cause birth defects in the baby.   Take your prenatal vitamins.   Try taking a stool softener (if your caregiver approves) if you develop constipation. Eat more high-fiber foods, such as fresh vegetables or fruit and whole grains. Drink plenty of fluids to keep your urine clear or pale yellow.   Take warm sitz baths to soothe any pain or discomfort caused by hemorrhoids. Use hemorrhoid cream if your caregiver approves.   If you develop varicose veins, wear support hose. Elevate your feet for 15 minutes, 3 4 times a day. Limit salt in your diet.   Avoid heavy lifting, wear low heal shoes, and practice good posture.   Rest a lot with your legs elevated if you have leg cramps or low back pain.   Visit your dentist if you have not gone during your pregnancy. Use a soft toothbrush to brush your teeth and be gentle when you floss.   A sexual relationship may be continued unless your caregiver directs you otherwise.   Do not travel far distances unless it is absolutely necessary and only with the approval of your caregiver.   Take prenatal classes to understand, practice, and ask questions about the labor and delivery.   Make a trial run to the hospital.   Pack your hospital bag.   Prepare the baby's nursery.   Continue to go to all your prenatal visits as directed  by your caregiver.  SEEK MEDICAL CARE IF:   You are unsure if you are in labor or if your water has broken.   You have dizziness.   You have mild pelvic cramps, pelvic pressure, or nagging pain in your abdominal area.   You have persistent nausea, vomiting, or diarrhea.   You have a bad smelling vaginal discharge.   You have pain with urination.  SEEK IMMEDIATE MEDICAL CARE IF:    You have a fever.   You are leaking fluid from your vagina.   You have spotting or bleeding from your vagina.     You have severe abdominal cramping or pain.   You have rapid weight loss or gain.   You have shortness of breath with chest pain.   You notice sudden or extreme swelling of your face, hands, ankles, feet, or legs.   You have not felt your baby move in over an hour.   You have severe headaches that do not go away with medicine.   You have vision changes.  Document Released: 11/13/2001 Document Revised: 07/22/2013 Document Reviewed: 01/20/2013  [employer] Patient Information 2014 [employer].

## 2014-04-27 NOTE — Progress Notes (Signed)
Called patient and informed of results and need to take antibiotic that is prescribed at her pharmacy.  Pt is concerned about taking antibiotic in pregnancy, informed patient/husband about the dangers of infection during pregnancy and she needs to take medication. Pt verbalizes understanding.

## 2014-04-27 NOTE — Progress Notes (Signed)
C/o of intermittent right sided abdominal pain that radiates down right leg.  C/o of left ear numbness and tingling.

## 2014-04-29 LAB — CULTURE, OB URINE: Colony Count: 35000

## 2014-05-11 ENCOUNTER — Ambulatory Visit (INDEPENDENT_AMBULATORY_CARE_PROVIDER_SITE_OTHER): Payer: Medicaid Other | Admitting: Advanced Practice Midwife

## 2014-05-11 ENCOUNTER — Encounter: Payer: Self-pay | Admitting: Advanced Practice Midwife

## 2014-05-11 VITALS — BP 124/78 | HR 96 | Temp 97.8°F | Wt 176.5 lb

## 2014-05-11 DIAGNOSIS — Z3400 Encounter for supervision of normal first pregnancy, unspecified trimester: Secondary | ICD-10-CM

## 2014-05-11 LAB — POCT URINALYSIS DIP (DEVICE)
Bilirubin Urine: NEGATIVE
GLUCOSE, UA: NEGATIVE mg/dL
HGB URINE DIPSTICK: NEGATIVE
Ketones, ur: NEGATIVE mg/dL
NITRITE: NEGATIVE
Protein, ur: NEGATIVE mg/dL
SPECIFIC GRAVITY, URINE: 1.015 (ref 1.005–1.030)
Urobilinogen, UA: 0.2 mg/dL (ref 0.0–1.0)
pH: 5.5 (ref 5.0–8.0)

## 2014-05-11 NOTE — Patient Instructions (Signed)
Third Trimester of Pregnancy  The third trimester is from week 29 through week 42, months 7 through 9. The third trimester is a time when the fetus is growing rapidly. At the end of the ninth month, the fetus is about 20 inches in length and weighs 6 10 pounds.   BODY CHANGES  Your body goes through many changes during pregnancy. The changes vary from woman to woman.    Your weight will continue to increase. You can expect to gain 25 35 pounds (11 16 kg) by the end of the pregnancy.   You may begin to get stretch marks on your hips, abdomen, and breasts.   You may urinate more often because the fetus is moving lower into your pelvis and pressing on your bladder.   You may develop or continue to have heartburn as a result of your pregnancy.   You may develop constipation because certain hormones are causing the muscles that push waste through your intestines to slow down.   You may develop hemorrhoids or swollen, bulging veins (varicose veins).   You may have pelvic pain because of the weight gain and pregnancy hormones relaxing your joints between the bones in your pelvis. Back aches may result from over exertion of the muscles supporting your posture.   Your breasts will continue to grow and be tender. A yellow discharge may leak from your breasts called colostrum.   Your belly button may stick out.   You may feel short of breath because of your expanding uterus.   You may notice the fetus "dropping," or moving lower in your abdomen.   You may have a bloody mucus discharge. This usually occurs a few days to a week before labor begins.   Your cervix becomes thin and soft (effaced) near your due date.  WHAT TO EXPECT AT YOUR PRENATAL EXAMS   You will have prenatal exams every 2 weeks until week 36. Then, you will have weekly prenatal exams. During a routine prenatal visit:   You will be weighed to make sure you and the fetus are growing normally.   Your blood pressure is taken.   Your abdomen will be  measured to track your baby's growth.   The fetal heartbeat will be listened to.   Any test results from the previous visit will be discussed.   You may have a cervical check near your due date to see if you have effaced.  At around 36 weeks, your caregiver will check your cervix. At the same time, your caregiver will also perform a test on the secretions of the vaginal tissue. This test is to determine if a type of bacteria, Group B streptococcus, is present. Your caregiver will explain this further.  Your caregiver may ask you:   What your birth plan is.   How you are feeling.   If you are feeling the baby move.   If you have had any abnormal symptoms, such as leaking fluid, bleeding, severe headaches, or abdominal cramping.   If you have any questions.  Other tests or screenings that may be performed during your third trimester include:   Blood tests that check for low iron levels (anemia).   Fetal testing to check the health, activity level, and growth of the fetus. Testing is done if you have certain medical conditions or if there are problems during the pregnancy.  FALSE LABOR  You may feel small, irregular contractions that eventually go away. These are called Braxton Hicks contractions, or   false labor. Contractions may last for hours, days, or even weeks before true labor sets in. If contractions come at regular intervals, intensify, or become painful, it is best to be seen by your caregiver.   SIGNS OF LABOR    Menstrual-like cramps.   Contractions that are 5 minutes apart or less.   Contractions that start on the top of the uterus and spread down to the lower abdomen and back.   A sense of increased pelvic pressure or back pain.   A watery or bloody mucus discharge that comes from the vagina.  If you have any of these signs before the 37th week of pregnancy, call your caregiver right away. You need to go to the hospital to get checked immediately.  HOME CARE INSTRUCTIONS    Avoid all  smoking, herbs, alcohol, and unprescribed drugs. These chemicals affect the formation and growth of the baby.   Follow your caregiver's instructions regarding medicine use. There are medicines that are either safe or unsafe to take during pregnancy.   Exercise only as directed by your caregiver. Experiencing uterine cramps is a good sign to stop exercising.   Continue to eat regular, healthy meals.   Wear a good support bra for breast tenderness.   Do not use hot tubs, steam rooms, or saunas.   Wear your seat belt at all times when driving.   Avoid raw meat, uncooked cheese, cat litter boxes, and soil used by cats. These carry germs that can cause birth defects in the baby.   Take your prenatal vitamins.   Try taking a stool softener (if your caregiver approves) if you develop constipation. Eat more high-fiber foods, such as fresh vegetables or fruit and whole grains. Drink plenty of fluids to keep your urine clear or pale yellow.   Take warm sitz baths to soothe any pain or discomfort caused by hemorrhoids. Use hemorrhoid cream if your caregiver approves.   If you develop varicose veins, wear support hose. Elevate your feet for 15 minutes, 3 4 times a day. Limit salt in your diet.   Avoid heavy lifting, wear low heal shoes, and practice good posture.   Rest a lot with your legs elevated if you have leg cramps or low back pain.   Visit your dentist if you have not gone during your pregnancy. Use a soft toothbrush to brush your teeth and be gentle when you floss.   A sexual relationship may be continued unless your caregiver directs you otherwise.   Do not travel far distances unless it is absolutely necessary and only with the approval of your caregiver.   Take prenatal classes to understand, practice, and ask questions about the labor and delivery.   Make a trial run to the hospital.   Pack your hospital bag.   Prepare the baby's nursery.   Continue to go to all your prenatal visits as directed  by your caregiver.  SEEK MEDICAL CARE IF:   You are unsure if you are in labor or if your water has broken.   You have dizziness.   You have mild pelvic cramps, pelvic pressure, or nagging pain in your abdominal area.   You have persistent nausea, vomiting, or diarrhea.   You have a bad smelling vaginal discharge.   You have pain with urination.  SEEK IMMEDIATE MEDICAL CARE IF:    You have a fever.   You are leaking fluid from your vagina.   You have spotting or bleeding from your vagina.     You have severe abdominal cramping or pain.   You have rapid weight loss or gain.   You have shortness of breath with chest pain.   You notice sudden or extreme swelling of your face, hands, ankles, feet, or legs.   You have not felt your baby move in over an hour.   You have severe headaches that do not go away with medicine.   You have vision changes.  Document Released: 11/13/2001 Document Revised: 07/22/2013 Document Reviewed: 01/20/2013  [employer] Patient Information 2014 [employer].

## 2014-05-11 NOTE — Progress Notes (Signed)
Pain-lower back, ligament pain

## 2014-05-25 ENCOUNTER — Ambulatory Visit (INDEPENDENT_AMBULATORY_CARE_PROVIDER_SITE_OTHER): Payer: Medicaid Other | Admitting: Family Medicine

## 2014-05-25 VITALS — BP 111/69 | HR 92 | Temp 96.8°F | Wt 181.9 lb

## 2014-05-25 DIAGNOSIS — Z3403 Encounter for supervision of normal first pregnancy, third trimester: Secondary | ICD-10-CM

## 2014-05-25 DIAGNOSIS — Z3400 Encounter for supervision of normal first pregnancy, unspecified trimester: Secondary | ICD-10-CM

## 2014-05-25 LAB — POCT URINALYSIS DIP (DEVICE)
BILIRUBIN URINE: NEGATIVE
Glucose, UA: NEGATIVE mg/dL
Hgb urine dipstick: NEGATIVE
Ketones, ur: NEGATIVE mg/dL
Nitrite: NEGATIVE
PH: 6.5 (ref 5.0–8.0)
Protein, ur: 30 mg/dL — AB
Specific Gravity, Urine: 1.025 (ref 1.005–1.030)
Urobilinogen, UA: 1 mg/dL (ref 0.0–1.0)

## 2014-05-25 NOTE — Progress Notes (Signed)
Patient reports mid to lower back pain

## 2014-05-25 NOTE — Progress Notes (Signed)
S: 23 yo G1 @ 3789w0d here for ROBV - doing well. Having back pain and some swelling in hands and feet.  No ctx, lof,vb +FM.    O: see flowsheet  A/P - doing well - f/u in 2 weeks for LROB - ptl precuations discussed

## 2014-05-25 NOTE — Patient Instructions (Signed)
Third Trimester of Pregnancy The third trimester is from week 29 through week 42, months 7 through 9. The third trimester is a time when the fetus is growing rapidly. At the end of the ninth month, the fetus is about 20 inches in length and weighs 6-10 pounds.  BODY CHANGES Your body goes through many changes during pregnancy. The changes vary from woman to woman.   Your weight will continue to increase. You can expect to gain 25-35 pounds (11-16 kg) by the end of the pregnancy.  You may begin to get stretch marks on your hips, abdomen, and breasts.  You may urinate more often because the fetus is moving lower into your pelvis and pressing on your bladder.  You may develop or continue to have heartburn as a result of your pregnancy.  You may develop constipation because certain hormones are causing the muscles that push waste through your intestines to slow down.  You may develop hemorrhoids or swollen, bulging veins (varicose veins).  You may have pelvic pain because of the weight gain and pregnancy hormones relaxing your joints between the bones in your pelvis. Backaches may result from overexertion of the muscles supporting your posture.  You may have changes in your hair. These can include thickening of your hair, rapid growth, and changes in texture. Some women also have hair loss during or after pregnancy, or hair that feels dry or thin. Your hair will most likely return to normal after your baby is born.  Your breasts will continue to grow and be tender. A yellow discharge may leak from your breasts called colostrum.  Your belly button may stick out.  You may feel short of breath because of your expanding uterus.  You may notice the fetus "dropping," or moving lower in your abdomen.  You may have a bloody mucus discharge. This usually occurs a few days to a week before labor begins.  Your cervix becomes thin and soft (effaced) near your due date. WHAT TO EXPECT AT YOUR PRENATAL  EXAMS  You will have prenatal exams every 2 weeks until week 36. Then, you will have weekly prenatal exams. During a routine prenatal visit:  You will be weighed to make sure you and the fetus are growing normally.  Your blood pressure is taken.  Your abdomen will be measured to track your baby's growth.  The fetal heartbeat will be listened to.  Any test results from the previous visit will be discussed.  You may have a cervical check near your due date to see if you have effaced. At around 36 weeks, your caregiver will check your cervix. At the same time, your caregiver will also perform a test on the secretions of the vaginal tissue. This test is to determine if a type of bacteria, Group B streptococcus, is present. Your caregiver will explain this further. Your caregiver may ask you:  What your birth plan is.  How you are feeling.  If you are feeling the baby move.  If you have had any abnormal symptoms, such as leaking fluid, bleeding, severe headaches, or abdominal cramping.  If you have any questions. Other tests or screenings that may be performed during your third trimester include:  Blood tests that check for low iron levels (anemia).  Fetal testing to check the health, activity level, and growth of the fetus. Testing is done if you have certain medical conditions or if there are problems during the pregnancy. FALSE LABOR You may feel small, irregular contractions that   eventually go away. These are called Braxton Hicks contractions, or false labor. Contractions may last for hours, days, or even weeks before true labor sets in. If contractions come at regular intervals, intensify, or become painful, it is best to be seen by your caregiver.  SIGNS OF LABOR   Menstrual-like cramps.  Contractions that are 5 minutes apart or less.  Contractions that start on the top of the uterus and spread down to the lower abdomen and back.  A sense of increased pelvic pressure or back  pain.  A watery or bloody mucus discharge that comes from the vagina. If you have any of these signs before the 37th week of pregnancy, call your caregiver right away. You need to go to the hospital to get checked immediately. HOME CARE INSTRUCTIONS   Avoid all smoking, herbs, alcohol, and unprescribed drugs. These chemicals affect the formation and growth of the baby.  Follow your caregiver's instructions regarding medicine use. There are medicines that are either safe or unsafe to take during pregnancy.  Exercise only as directed by your caregiver. Experiencing uterine cramps is a good sign to stop exercising.  Continue to eat regular, healthy meals.  Wear a good support bra for breast tenderness.  Do not use hot tubs, steam rooms, or saunas.  Wear your seat belt at all times when driving.  Avoid raw meat, uncooked cheese, cat litter boxes, and soil used by cats. These carry germs that can cause birth defects in the baby.  Take your prenatal vitamins.  Try taking a stool softener (if your caregiver approves) if you develop constipation. Eat more high-fiber foods, such as fresh vegetables or fruit and whole grains. Drink plenty of fluids to keep your urine clear or pale yellow.  Take warm sitz baths to soothe any pain or discomfort caused by hemorrhoids. Use hemorrhoid cream if your caregiver approves.  If you develop varicose veins, wear support hose. Elevate your feet for 15 minutes, 3-4 times a day. Limit salt in your diet.  Avoid heavy lifting, wear low heal shoes, and practice good posture.  Rest a lot with your legs elevated if you have leg cramps or low back pain.  Visit your dentist if you have not gone during your pregnancy. Use a soft toothbrush to brush your teeth and be gentle when you floss.  A sexual relationship may be continued unless your caregiver directs you otherwise.  Do not travel far distances unless it is absolutely necessary and only with the approval  of your caregiver.  Take prenatal classes to understand, practice, and ask questions about the labor and delivery.  Make a trial run to the hospital.  Pack your hospital bag.  Prepare the baby's nursery.  Continue to go to all your prenatal visits as directed by your caregiver. SEEK MEDICAL CARE IF:  You are unsure if you are in labor or if your water has broken.  You have dizziness.  You have mild pelvic cramps, pelvic pressure, or nagging pain in your abdominal area.  You have persistent nausea, vomiting, or diarrhea.  You have a bad smelling vaginal discharge.  You have pain with urination. SEEK IMMEDIATE MEDICAL CARE IF:   You have a fever.  You are leaking fluid from your vagina.  You have spotting or bleeding from your vagina.  You have severe abdominal cramping or pain.  You have rapid weight loss or gain.  You have shortness of breath with chest pain.  You notice sudden or extreme swelling   of your face, hands, ankles, feet, or legs.  You have not felt your baby move in over an hour.  You have severe headaches that do not go away with medicine.  You have vision changes. Document Released: 11/13/2001 Document Revised: 11/24/2013 Document Reviewed: 01/20/2013 [employer] Patient Information 2015 [employer]. This information is not intended to replace advice given to you by your health care provider. Make sure you discuss any questions you have with your health care provider.  

## 2014-06-11 ENCOUNTER — Encounter: Payer: Self-pay | Admitting: Obstetrics and Gynecology

## 2014-06-11 ENCOUNTER — Ambulatory Visit (INDEPENDENT_AMBULATORY_CARE_PROVIDER_SITE_OTHER): Payer: Medicaid Other | Admitting: Obstetrics and Gynecology

## 2014-06-11 VITALS — BP 116/79 | HR 94 | Wt 186.7 lb

## 2014-06-11 DIAGNOSIS — Z3403 Encounter for supervision of normal first pregnancy, third trimester: Secondary | ICD-10-CM

## 2014-06-11 DIAGNOSIS — Z3400 Encounter for supervision of normal first pregnancy, unspecified trimester: Secondary | ICD-10-CM

## 2014-06-11 LAB — OB RESULTS CONSOLE GC/CHLAMYDIA
CHLAMYDIA, DNA PROBE: NEGATIVE
Gonorrhea: NEGATIVE

## 2014-06-11 LAB — OB RESULTS CONSOLE GBS: GBS: NEGATIVE

## 2014-06-11 NOTE — Addendum Note (Signed)
Addended by: Aldona LentoFISHER, Saman Umstead L on: 06/11/2014 11:22 AM   Modules accepted: Orders

## 2014-06-11 NOTE — Progress Notes (Signed)
Doing well. Cultures done. Goes to yoga class.

## 2014-06-11 NOTE — Patient Instructions (Signed)
Third Trimester of Pregnancy The third trimester is from week 29 through week 42, months 7 through 9. The third trimester is a time when the fetus is growing rapidly. At the end of the ninth month, the fetus is about 20 inches in length and weighs 6-10 pounds.  BODY CHANGES Your body goes through many changes during pregnancy. The changes vary from woman to woman.   Your weight will continue to increase. You can expect to gain 25-35 pounds (11-16 kg) by the end of the pregnancy.  You may begin to get stretch marks on your hips, abdomen, and breasts.  You may urinate more often because the fetus is moving lower into your pelvis and pressing on your bladder.  You may develop or continue to have heartburn as a result of your pregnancy.  You may develop constipation because certain hormones are causing the muscles that push waste through your intestines to slow down.  You may develop hemorrhoids or swollen, bulging veins (varicose veins).  You may have pelvic pain because of the weight gain and pregnancy hormones relaxing your joints between the bones in your pelvis. Backaches may result from overexertion of the muscles supporting your posture.  You may have changes in your hair. These can include thickening of your hair, rapid growth, and changes in texture. Some women also have hair loss during or after pregnancy, or hair that feels dry or thin. Your hair will most likely return to normal after your baby is born.  Your breasts will continue to grow and be tender. A yellow discharge may leak from your breasts called colostrum.  Your belly button may stick out.  You may feel short of breath because of your expanding uterus.  You may notice the fetus "dropping," or moving lower in your abdomen.  You may have a bloody mucus discharge. This usually occurs a few days to a week before labor begins.  Your cervix becomes thin and soft (effaced) near your due date. WHAT TO EXPECT AT YOUR PRENATAL  EXAMS  You will have prenatal exams every 2 weeks until week 36. Then, you will have weekly prenatal exams. During a routine prenatal visit:  You will be weighed to make sure you and the fetus are growing normally.  Your blood pressure is taken.  Your abdomen will be measured to track your baby's growth.  The fetal heartbeat will be listened to.  Any test results from the previous visit will be discussed.  You may have a cervical check near your due date to see if you have effaced. At around 36 weeks, your caregiver will check your cervix. At the same time, your caregiver will also perform a test on the secretions of the vaginal tissue. This test is to determine if a type of bacteria, Group B streptococcus, is present. Your caregiver will explain this further. Your caregiver may ask you:  What your birth plan is.  How you are feeling.  If you are feeling the baby move.  If you have had any abnormal symptoms, such as leaking fluid, bleeding, severe headaches, or abdominal cramping.  If you have any questions. Other tests or screenings that may be performed during your third trimester include:  Blood tests that check for low iron levels (anemia).  Fetal testing to check the health, activity level, and growth of the fetus. Testing is done if you have certain medical conditions or if there are problems during the pregnancy. FALSE LABOR You may feel small, irregular contractions that   eventually go away. These are called Braxton Hicks contractions, or false labor. Contractions may last for hours, days, or even weeks before true labor sets in. If contractions come at regular intervals, intensify, or become painful, it is best to be seen by your caregiver.  SIGNS OF LABOR   Menstrual-like cramps.  Contractions that are 5 minutes apart or less.  Contractions that start on the top of the uterus and spread down to the lower abdomen and back.  A sense of increased pelvic pressure or back  pain.  A watery or bloody mucus discharge that comes from the vagina. If you have any of these signs before the 37th week of pregnancy, call your caregiver right away. You need to go to the hospital to get checked immediately. HOME CARE INSTRUCTIONS   Avoid all smoking, herbs, alcohol, and unprescribed drugs. These chemicals affect the formation and growth of the baby.  Follow your caregiver's instructions regarding medicine use. There are medicines that are either safe or unsafe to take during pregnancy.  Exercise only as directed by your caregiver. Experiencing uterine cramps is a good sign to stop exercising.  Continue to eat regular, healthy meals.  Wear a good support bra for breast tenderness.  Do not use hot tubs, steam rooms, or saunas.  Wear your seat belt at all times when driving.  Avoid raw meat, uncooked cheese, cat litter boxes, and soil used by cats. These carry germs that can cause birth defects in the baby.  Take your prenatal vitamins.  Try taking a stool softener (if your caregiver approves) if you develop constipation. Eat more high-fiber foods, such as fresh vegetables or fruit and whole grains. Drink plenty of fluids to keep your urine clear or pale yellow.  Take warm sitz baths to soothe any pain or discomfort caused by hemorrhoids. Use hemorrhoid cream if your caregiver approves.  If you develop varicose veins, wear support hose. Elevate your feet for 15 minutes, 3-4 times a day. Limit salt in your diet.  Avoid heavy lifting, wear low heal shoes, and practice good posture.  Rest a lot with your legs elevated if you have leg cramps or low back pain.  Visit your dentist if you have not gone during your pregnancy. Use a soft toothbrush to brush your teeth and be gentle when you floss.  A sexual relationship may be continued unless your caregiver directs you otherwise.  Do not travel far distances unless it is absolutely necessary and only with the approval  of your caregiver.  Take prenatal classes to understand, practice, and ask questions about the labor and delivery.  Make a trial run to the hospital.  Pack your hospital bag.  Prepare the baby's nursery.  Continue to go to all your prenatal visits as directed by your caregiver. SEEK MEDICAL CARE IF:  You are unsure if you are in labor or if your water has broken.  You have dizziness.  You have mild pelvic cramps, pelvic pressure, or nagging pain in your abdominal area.  You have persistent nausea, vomiting, or diarrhea.  You have a bad smelling vaginal discharge.  You have pain with urination. SEEK IMMEDIATE MEDICAL CARE IF:   You have a fever.  You are leaking fluid from your vagina.  You have spotting or bleeding from your vagina.  You have severe abdominal cramping or pain.  You have rapid weight loss or gain.  You have shortness of breath with chest pain.  You notice sudden or extreme swelling   of your face, hands, ankles, feet, or legs.  You have not felt your baby move in over an hour.  You have severe headaches that do not go away with medicine.  You have vision changes. Document Released: 11/13/2001 Document Revised: 11/24/2013 Document Reviewed: 01/20/2013 [employer] Patient Information 2015 [employer]. This information is not intended to replace advice given to you by your health care provider. Make sure you discuss any questions you have with your health care provider.  

## 2014-06-12 LAB — GC/CHLAMYDIA PROBE AMP
CT Probe RNA: NEGATIVE
GC PROBE AMP APTIMA: NEGATIVE

## 2014-06-13 LAB — CULTURE, BETA STREP (GROUP B ONLY)

## 2014-06-14 LAB — POCT URINALYSIS DIP (DEVICE)
BILIRUBIN URINE: NEGATIVE
Glucose, UA: NEGATIVE mg/dL
KETONES UR: NEGATIVE mg/dL
Nitrite: NEGATIVE
PH: 5 (ref 5.0–8.0)
PROTEIN: NEGATIVE mg/dL
SPECIFIC GRAVITY, URINE: 1.025 (ref 1.005–1.030)
Urobilinogen, UA: 0.2 mg/dL (ref 0.0–1.0)

## 2014-06-16 ENCOUNTER — Ambulatory Visit (INDEPENDENT_AMBULATORY_CARE_PROVIDER_SITE_OTHER): Payer: Medicaid Other | Admitting: Obstetrics and Gynecology

## 2014-06-16 ENCOUNTER — Encounter: Payer: Self-pay | Admitting: Obstetrics and Gynecology

## 2014-06-16 VITALS — BP 129/83 | HR 88 | Temp 97.7°F | Wt 187.0 lb

## 2014-06-16 DIAGNOSIS — Z3403 Encounter for supervision of normal first pregnancy, third trimester: Secondary | ICD-10-CM

## 2014-06-16 DIAGNOSIS — Z3400 Encounter for supervision of normal first pregnancy, unspecified trimester: Secondary | ICD-10-CM

## 2014-06-16 LAB — POCT URINALYSIS DIP (DEVICE)
Bilirubin Urine: NEGATIVE
Glucose, UA: NEGATIVE mg/dL
Ketones, ur: NEGATIVE mg/dL
Nitrite: NEGATIVE
PH: 6 (ref 5.0–8.0)
PROTEIN: 30 mg/dL — AB
SPECIFIC GRAVITY, URINE: 1.025 (ref 1.005–1.030)
Urobilinogen, UA: 1 mg/dL (ref 0.0–1.0)

## 2014-06-16 NOTE — Progress Notes (Signed)
Occasional edema in feet.  Tdap information given-- patient to decide by next visit whether or not she wants it.

## 2014-06-16 NOTE — Progress Notes (Signed)
Doing well. Good FM. S/Sx labor and neg culture results reviewed.

## 2014-06-16 NOTE — Patient Instructions (Addendum)
Third Trimester of Pregnancy The third trimester is from week 29 through week 42, months 7 through 9. The third trimester is a time when the fetus is growing rapidly. At the end of the ninth month, the fetus is about 20 inches in length and weighs 6-10 pounds.  BODY CHANGES Your body goes through many changes during pregnancy. The changes vary from woman to woman.   Your weight will continue to increase. You can expect to gain 25-35 pounds (11-16 kg) by the end of the pregnancy.  You may begin to get stretch marks on your hips, abdomen, and breasts.  You may urinate more often because the fetus is moving lower into your pelvis and pressing on your bladder.  You may develop or continue to have heartburn as a result of your pregnancy.  You may develop constipation because certain hormones are causing the muscles that push waste through your intestines to slow down.  You may develop hemorrhoids or swollen, bulging veins (varicose veins).  You may have pelvic pain because of the weight gain and pregnancy hormones relaxing your joints between the bones in your pelvis. Backaches may result from overexertion of the muscles supporting your posture.  You may have changes in your hair. These can include thickening of your hair, rapid growth, and changes in texture. Some women also have hair loss during or after pregnancy, or hair that feels dry or thin. Your hair will most likely return to normal after your baby is born.  Your breasts will continue to grow and be tender. A yellow discharge may leak from your breasts called colostrum.  Your belly button may stick out.  You may feel short of breath because of your expanding uterus.  You may notice the fetus "dropping," or moving lower in your abdomen.  You may have a bloody mucus discharge. This usually occurs a few days to a week before labor begins.  Your cervix becomes thin and soft (effaced) near your due date. WHAT TO EXPECT AT YOUR PRENATAL  EXAMS  You will have prenatal exams every 2 weeks until week 36. Then, you will have weekly prenatal exams. During a routine prenatal visit:  You will be weighed to make sure you and the fetus are growing normally.  Your blood pressure is taken.  Your abdomen will be measured to track your baby's growth.  The fetal heartbeat will be listened to.  Any test results from the previous visit will be discussed.  You may have a cervical check near your due date to see if you have effaced. At around 36 weeks, your caregiver will check your cervix. At the same time, your caregiver will also perform a test on the secretions of the vaginal tissue. This test is to determine if a type of bacteria, Group B streptococcus, is present. Your caregiver will explain this further. Your caregiver may ask you:  What your birth plan is.  How you are feeling.  If you are feeling the baby move.  If you have had any abnormal symptoms, such as leaking fluid, bleeding, severe headaches, or abdominal cramping.  If you have any questions. Other tests or screenings that may be performed during your third trimester include:  Blood tests that check for low iron levels (anemia).  Fetal testing to check the health, activity level, and growth of the fetus. Testing is done if you have certain medical conditions or if there are problems during the pregnancy. FALSE LABOR You may feel small, irregular contractions that   eventually go away. These are called Braxton Hicks contractions, or false labor. Contractions may last for hours, days, or even weeks before true labor sets in. If contractions come at regular intervals, intensify, or become painful, it is best to be seen by your caregiver.  SIGNS OF LABOR   Menstrual-like cramps.  Contractions that are 5 minutes apart or less.  Contractions that start on the top of the uterus and spread down to the lower abdomen and back.  A sense of increased pelvic pressure or back  pain.  A watery or bloody mucus discharge that comes from the vagina. If you have any of these signs before the 37th week of pregnancy, call your caregiver right away. You need to go to the hospital to get checked immediately. HOME CARE INSTRUCTIONS   Avoid all smoking, herbs, alcohol, and unprescribed drugs. These chemicals affect the formation and growth of the baby.  Follow your caregiver's instructions regarding medicine use. There are medicines that are either safe or unsafe to take during pregnancy.  Exercise only as directed by your caregiver. Experiencing uterine cramps is a good sign to stop exercising.  Continue to eat regular, healthy meals.  Wear a good support bra for breast tenderness.  Do not use hot tubs, steam rooms, or saunas.  Wear your seat belt at all times when driving.  Avoid raw meat, uncooked cheese, cat litter boxes, and soil used by cats. These carry germs that can cause birth defects in the baby.  Take your prenatal vitamins.  Try taking a stool softener (if your caregiver approves) if you develop constipation. Eat more high-fiber foods, such as fresh vegetables or fruit and whole grains. Drink plenty of fluids to keep your urine clear or pale yellow.  Take warm sitz baths to soothe any pain or discomfort caused by hemorrhoids. Use hemorrhoid cream if your caregiver approves.  If you develop varicose veins, wear support hose. Elevate your feet for 15 minutes, 3-4 times a day. Limit salt in your diet.  Avoid heavy lifting, wear low heal shoes, and practice good posture.  Rest a lot with your legs elevated if you have leg cramps or low back pain.  Visit your dentist if you have not gone during your pregnancy. Use a soft toothbrush to brush your teeth and be gentle when you floss.  A sexual relationship may be continued unless your caregiver directs you otherwise.  Do not travel far distances unless it is absolutely necessary and only with the approval  of your caregiver.  Take prenatal classes to understand, practice, and ask questions about the labor and delivery.  Make a trial run to the hospital.  Pack your hospital bag.  Prepare the baby's nursery.  Continue to go to all your prenatal visits as directed by your caregiver. SEEK MEDICAL CARE IF:  You are unsure if you are in labor or if your water has broken.  You have dizziness.  You have mild pelvic cramps, pelvic pressure, or nagging pain in your abdominal area.  You have persistent nausea, vomiting, or diarrhea.  You have a bad smelling vaginal discharge.  You have pain with urination. SEEK IMMEDIATE MEDICAL CARE IF:   You have a fever.  You are leaking fluid from your vagina.  You have spotting or bleeding from your vagina.  You have severe abdominal cramping or pain.  You have rapid weight loss or gain.  You have shortness of breath with chest pain.  You notice sudden or extreme swelling   of your face, hands, ankles, feet, or legs.  You have not felt your baby move in over an hour.  You have severe headaches that do not go away with medicine.  You have vision changes. Document Released: 11/13/2001 Document Revised: 11/24/2013 Document Reviewed: 01/20/2013 [employer] Patient Information 2015 [employer]. This information is not intended to replace advice given to you by your health care provider. Make sure you discuss any questions you have with your health care provider. Contraception Choices Contraception (birth control) is the use of any methods or devices to prevent pregnancy. Below are some methods to help avoid pregnancy. HORMONAL METHODS   Contraceptive implant. This is a thin, plastic tube containing progesterone hormone. It does not contain estrogen hormone. Your health care provider inserts the tube in the inner part of the upper arm. The tube can remain in place for up to 3 years. After 3 years, the implant must be removed. The implant prevents  the ovaries from releasing an egg (ovulation), thickens the cervical mucus to prevent sperm from entering the uterus, and thins the lining of the inside of the uterus.  Progesterone-only injections. These injections are given every 3 months by your health care provider to prevent pregnancy. This synthetic progesterone hormone stops the ovaries from releasing eggs. It also thickens cervical mucus and changes the uterine lining. This makes it harder for sperm to survive in the uterus.  Birth control pills. These pills contain estrogen and progesterone hormone. They work by preventing the ovaries from releasing eggs (ovulation). They also cause the cervical mucus to thicken, preventing the sperm from entering the uterus. Birth control pills are prescribed by a health care provider.Birth control pills can also be used to treat heavy periods.  Minipill. This type of birth control pill contains only the progesterone hormone. They are taken every day of each month and must be prescribed by your health care provider.  Birth control patch. The patch contains hormones similar to those in birth control pills. It must be changed once a week and is prescribed by a health care provider.  Vaginal ring. The ring contains hormones similar to those in birth control pills. It is left in the vagina for 3 weeks, removed for 1 week, and then a new one is put back in place. The patient must be comfortable inserting and removing the ring from the vagina.A health care provider's prescription is necessary.  Emergency contraception. Emergency contraceptives prevent pregnancy after unprotected sexual intercourse. This pill can be taken right after sex or up to 5 days after unprotected sex. It is most effective the sooner you take the pills after having sexual intercourse. Most emergency contraceptive pills are available without a prescription. Check with your pharmacist. Do not use emergency contraception as your only form of  birth control. BARRIER METHODS   Female condom. This is a thin sheath (latex or rubber) that is worn over the penis during sexual intercourse. It can be used with spermicide to increase effectiveness.  Female condom. This is a soft, loose-fitting sheath that is put into the vagina before sexual intercourse.  Diaphragm. This is a soft, latex, dome-shaped barrier that must be fitted by a health care provider. It is inserted into the vagina, along with a spermicidal jelly. It is inserted before intercourse. The diaphragm should be left in the vagina for 6 to 8 hours after intercourse.  Cervical cap. This is a round, soft, latex or plastic cup that fits over the cervix and must be fitted   by a health care provider. The cap can be left in place for up to 48 hours after intercourse.  Sponge. This is a soft, circular piece of polyurethane foam. The sponge has spermicide in it. It is inserted into the vagina after wetting it and before sexual intercourse.  Spermicides. These are chemicals that kill or block sperm from entering the cervix and uterus. They come in the form of creams, jellies, suppositories, foam, or tablets. They do not require a prescription. They are inserted into the vagina with an applicator before having sexual intercourse. The process must be repeated every time you have sexual intercourse. INTRAUTERINE CONTRACEPTION  Intrauterine device (IUD). This is a T-shaped device that is put in a woman's uterus during a menstrual period to prevent pregnancy. There are 2 types:  Copper IUD. This type of IUD is wrapped in copper wire and is placed inside the uterus. Copper makes the uterus and fallopian tubes produce a fluid that kills sperm. It can stay in place for 10 years.  Hormone IUD. This type of IUD contains the hormone progestin (synthetic progesterone). The hormone thickens the cervical mucus and prevents sperm from entering the uterus, and it also thins the uterine lining to prevent  implantation of a fertilized egg. The hormone can weaken or kill the sperm that get into the uterus. It can stay in place for 3-5 years, depending on which type of IUD is used. PERMANENT METHODS OF CONTRACEPTION  Female tubal ligation. This is when the woman's fallopian tubes are surgically sealed, tied, or blocked to prevent the egg from traveling to the uterus.  Hysteroscopic sterilization. This involves placing a small coil or insert into each fallopian tube. Your doctor uses a technique called hysteroscopy to do the procedure. The device causes scar tissue to form. This results in permanent blockage of the fallopian tubes, so the sperm cannot fertilize the egg. It takes about 3 months after the procedure for the tubes to become blocked. You must use another form of birth control for these 3 months.  Female sterilization. This is when the female has the tubes that carry sperm tied off (vasectomy).This blocks sperm from entering the vagina during sexual intercourse. After the procedure, the man can still ejaculate fluid (semen). NATURAL PLANNING METHODS  Natural family planning. This is not having sexual intercourse or using a barrier method (condom, diaphragm, cervical cap) on days the woman could become pregnant.  Calendar method. This is keeping track of the length of each menstrual cycle and identifying when you are fertile.  Ovulation method. This is avoiding sexual intercourse during ovulation.  Symptothermal method. This is avoiding sexual intercourse during ovulation, using a thermometer and ovulation symptoms.  Post-ovulation method. This is timing sexual intercourse after you have ovulated. Regardless of which type or method of contraception you choose, it is important that you use condoms to protect against the transmission of sexually transmitted infections (STIs). Talk with your health care provider about which form of contraception is most appropriate for you. Document Released:  11/19/2005 Document Revised: 11/24/2013 Document Reviewed: 05/14/2013 [employer] Patient Information 2015 [employer]. This information is not intended to replace advice given to you by your health care provider. Make sure you discuss any questions you have with your health care provider.  

## 2014-06-23 ENCOUNTER — Ambulatory Visit (INDEPENDENT_AMBULATORY_CARE_PROVIDER_SITE_OTHER): Payer: Medicaid Other | Admitting: Family Medicine

## 2014-06-23 VITALS — BP 124/90 | HR 105 | Temp 98.2°F | Wt 192.1 lb

## 2014-06-23 DIAGNOSIS — Z23 Encounter for immunization: Secondary | ICD-10-CM

## 2014-06-23 DIAGNOSIS — Z3403 Encounter for supervision of normal first pregnancy, third trimester: Secondary | ICD-10-CM

## 2014-06-23 DIAGNOSIS — Z3400 Encounter for supervision of normal first pregnancy, unspecified trimester: Secondary | ICD-10-CM

## 2014-06-23 LAB — POCT URINALYSIS DIP (DEVICE)
Bilirubin Urine: NEGATIVE
GLUCOSE, UA: NEGATIVE mg/dL
HGB URINE DIPSTICK: NEGATIVE
Ketones, ur: NEGATIVE mg/dL
Nitrite: NEGATIVE
PH: 5.5 (ref 5.0–8.0)
PROTEIN: NEGATIVE mg/dL
Specific Gravity, Urine: 1.02 (ref 1.005–1.030)
UROBILINOGEN UA: 1 mg/dL (ref 0.0–1.0)

## 2014-06-23 MED ORDER — TETANUS-DIPHTH-ACELL PERTUSSIS 5-2.5-18.5 LF-MCG/0.5 IM SUSP: 0.5000 mL | Freq: Once | INTRAMUSCULAR | Status: DC

## 2014-06-23 NOTE — Patient Instructions (Addendum)
Fetal Movement Counts Patient Name: __________________________________________________ Patient Due Date: ____________________ Performing a fetal movement count is highly recommended in high-risk pregnancies, but it is good for every pregnant woman to do. Your caregiver may ask you to start counting fetal movements at 28 weeks of the pregnancy. Fetal movements often increase:  After eating a full meal.  After physical activity.  After eating or drinking something sweet or cold.  At rest. Pay attention to when you feel the baby is most active. This will help you notice a pattern of your baby's sleep and wake cycles and what factors contribute to an increase in fetal movement. It is important to perform a fetal movement count at the same time each day when your baby is normally most active.  HOW TO COUNT FETAL MOVEMENTS 1. Find a quiet and comfortable area to sit or lie down on your left side. Lying on your left side provides the best blood and oxygen circulation to your baby. 2. Write down the day and time on a sheet of paper or in a journal. 3. Start counting kicks, flutters, swishes, rolls, or jabs in a 2 hour period. You should feel at least 10 movements within 2 hours. 4. If you do not feel 10 movements in 2 hours, wait 2-3 hours and count again. Look for a change in the pattern or not enough counts in 2 hours. SEEK MEDICAL CARE IF:  You feel less than 10 counts in 2 hours, tried twice.  There is no movement in over an hour.  The pattern is changing or taking longer each day to reach 10 counts in 2 hours.  You feel the baby is not moving as he or she usually does. Date: ____________ Movements: ____________ Start time: ____________ Elizebeth Koller time: ____________  Date: ____________ Movements: ____________ Start time: ____________ Elizebeth Koller time: ____________ Date: ____________ Movements: ____________ Start time: ____________ Elizebeth Koller time: ____________ Date: ____________ Movements: ____________  Start time: ____________ Elizebeth Koller time: ____________ Date: ____________ Movements: ____________ Start time: ____________ Elizebeth Koller time: ____________ Date: ____________ Movements: ____________ Start time: ____________ Elizebeth Koller time: ____________ Date: ____________ Movements: ____________ Start time: ____________ Elizebeth Koller time: ____________ Date: ____________ Movements: ____________ Start time: ____________ Elizebeth Koller time: ____________  Date: ____________ Movements: ____________ Start time: ____________ Elizebeth Koller time: ____________ Date: ____________ Movements: ____________ Start time: ____________ Elizebeth Koller time: ____________ Date: ____________ Movements: ____________ Start time: ____________ Elizebeth Koller time: ____________ Date: ____________ Movements: ____________ Start time: ____________ Elizebeth Koller time: ____________ Date: ____________ Movements: ____________ Start time: ____________ Elizebeth Koller time: ____________ Date: ____________ Movements: ____________ Start time: ____________ Elizebeth Koller time: ____________ Date: ____________ Movements: ____________ Start time: ____________ Elizebeth Koller time: ____________  Date: ____________ Movements: ____________ Start time: ____________ Elizebeth Koller time: ____________ Date: ____________ Movements: ____________ Start time: ____________ Elizebeth Koller time: ____________ Date: ____________ Movements: ____________ Start time: ____________ Elizebeth Koller time: ____________ Date: ____________ Movements: ____________ Start time: ____________ Elizebeth Koller time: ____________ Date: ____________ Movements: ____________ Start time: ____________ Elizebeth Koller time: ____________ Date: ____________ Movements: ____________ Start time: ____________ Elizebeth Koller time: ____________ Date: ____________ Movements: ____________ Start time: ____________ Elizebeth Koller time: ____________  Date: ____________ Movements: ____________ Start time: ____________ Elizebeth Koller time: ____________ Date: ____________ Movements: ____________ Start time: ____________ Elizebeth Koller time:  ____________ Date: ____________ Movements: ____________ Start time: ____________ Elizebeth Koller time: ____________ Date: ____________ Movements: ____________ Start time: ____________ Elizebeth Koller time: ____________ Date: ____________ Movements: ____________ Start time: ____________ Elizebeth Koller time: ____________ Date: ____________ Movements: ____________ Start time: ____________ Elizebeth Koller time: ____________ Date: ____________ Movements: ____________ Start time: ____________ Elizebeth Koller time: ____________  Date: ____________ Movements: ____________ Start time: ____________ Elizebeth Koller time:  ____________ °Date: ____________ Movements: ____________ Start time: ____________ Finish time: ____________ °Date: ____________ Movements: ____________ Start time: ____________ Finish time: ____________ °Date: ____________ Movements: ____________ Start time: ____________ Finish time: ____________ °Date: ____________ Movements: ____________ Start time: ____________ Finish time: ____________ °Date: ____________ Movements: ____________ Start time: ____________ Finish time: ____________ °Date: ____________ Movements: ____________ Start time: ____________ Finish time: ____________  °Date: ____________ Movements: ____________ Start time: ____________ Finish time: ____________ °Date: ____________ Movements: ____________ Start time: ____________ Finish time: ____________ °Date: ____________ Movements: ____________ Start time: ____________ Finish time: ____________ °Date: ____________ Movements: ____________ Start time: ____________ Finish time: ____________ °Date: ____________ Movements: ____________ Start time: ____________ Finish time: ____________ °Date: ____________ Movements: ____________ Start time: ____________ Finish time: ____________ °Date: ____________ Movements: ____________ Start time: ____________ Finish time: ____________  °Date: ____________ Movements: ____________ Start time: ____________ Finish time: ____________ °Date: ____________ Movements:  ____________ Start time: ____________ Finish time: ____________ °Date: ____________ Movements: ____________ Start time: ____________ Finish time: ____________ °Date: ____________ Movements: ____________ Start time: ____________ Finish time: ____________ °Date: ____________ Movements: ____________ Start time: ____________ Finish time: ____________ °Date: ____________ Movements: ____________ Start time: ____________ Finish time: ____________ °Date: ____________ Movements: ____________ Start time: ____________ Finish time: ____________  °Date: ____________ Movements: ____________ Start time: ____________ Finish time: ____________ °Date: ____________ Movements: ____________ Start time: ____________ Finish time: ____________ °Date: ____________ Movements: ____________ Start time: ____________ Finish time: ____________ °Date: ____________ Movements: ____________ Start time: ____________ Finish time: ____________ °Date: ____________ Movements: ____________ Start time: ____________ Finish time: ____________ °Date: ____________ Movements: ____________ Start time: ____________ Finish time: ____________ °Document Released: 12/19/2006 Document Revised: 11/05/2012 Document Reviewed: 09/15/2012 °[employer]® Patient Information ©2015 [employer]. This information is not intended to replace advice given to you by your health care provider. Make sure you discuss any questions you have with your health care provider. °Preeclampsia and Eclampsia °Preeclampsia is a serious condition that develops only during pregnancy. It is also called toxemia of pregnancy. This condition causes high blood pressure along with other symptoms, such as swelling and headaches. These may develop as the condition gets worse. Preeclampsia may occur 20 weeks or later into your pregnancy.  °Diagnosing and treating preeclampsia early is very important. If not treated early, it can cause serious problems for you and your baby. One problem it can lead to is  eclampsia, which is a condition that causes muscle jerking or shaking (convulsions) in the mother. Delivering your baby is the best treatment for preeclampsia or eclampsia.  °RISK FACTORS °The cause of preeclampsia is not known. You may be more likely to develop preeclampsia if you have certain risk factors. These include:  °· Being pregnant for the first time. °· Having preeclampsia in a past pregnancy. °· Having a family history of preeclampsia. °· Having high blood pressure. °· Being pregnant with twins or triplets. °· Being 35 or older. °· Being African American. °· Having kidney disease or diabetes. °· Having medical conditions such as lupus or blood diseases. °· Being very overweight (obese). °SIGNS AND SYMPTOMS  °The earliest signs of preeclampsia are: °· High blood pressure. °· Increased protein in your urine. Your health care provider will check for this at every prenatal visit. °Other symptoms that can develop include:  °· Severe headaches. °· Sudden weight gain. °· Swelling of your hands, face, legs, and feet. °· Feeling sick to your stomach (nauseous) and throwing up (vomiting). °· Vision problems (blurred or double vision). °· Numbness in your face, arms, legs, and feet. °· Dizziness. °· Slurred speech. °· Sensitivity to bright   lights. °· Abdominal pain. °DIAGNOSIS  °There are no screening tests for preeclampsia. Your health care provider will ask you about symptoms and check for signs of preeclampsia during your prenatal visits. You may also have tests, including: °· Urine testing. °· Blood testing. °· Checking your baby's heart rate. °· Checking the health of your baby and your placenta using images created with sound waves (ultrasound). °TREATMENT  °You can work out the best treatment approach together with your health care provider. It is very important to keep all prenatal appointments. If you have an increased risk of preeclampsia, you may need more frequent prenatal exams. °· Your health care  provider may prescribe bed rest. °· You may have to eat as little salt as possible. °· You may need to take medicine to lower your blood pressure if the condition does not respond to more conservative measures. °· You may need to stay in the hospital if your condition is severe. There, treatment will focus on controlling your blood pressure and fluid retention. You may also need to take medicine to prevent seizures. °· If the condition gets worse, your baby may need to be delivered early to protect you and the baby. You may have your labor started with medicine (be induced), or you may have a cesarean delivery. °· Preeclampsia usually goes away after the baby is born. °HOME CARE INSTRUCTIONS  °· Only take over-the-counter or prescription medicines as directed by your health care provider. °· Lie on your left side while resting. This keeps pressure off your baby. °· Elevate your feet while resting. °· Get regular exercise. Ask your health care provider what type of exercise is safe for you. °· Avoid caffeine and alcohol. °· Do not smoke. °· Drink 6-8 glasses of water every day. °· Eat a balanced diet that is low in salt. Do not add salt to your food. °· Avoid stressful situations as much as possible. °· Get plenty of rest and sleep. °· Keep all prenatal appointments and tests as scheduled. °SEEK MEDICAL CARE IF: °· You are gaining more weight than expected. °· You have any headaches, abdominal pain, or nausea. °· You are bruising more than usual. °· You feel dizzy or light-headed. °SEEK IMMEDIATE MEDICAL CARE IF:  °· You develop sudden or severe swelling anywhere in your body. This usually happens in the legs. °· You gain 5 lb (2.3 kg) or more in a week. °· You have a severe headache, dizziness, problems with your vision, or confusion. °· You have severe abdominal pain. °· You have lasting nausea or vomiting. °· You have a seizure. °· You have trouble moving any part of your body. °· You develop numbness in your  body. °· You have trouble speaking. °· You have any abnormal bleeding. °· You develop a stiff neck. °· You pass out. °MAKE SURE YOU:  °· Understand these instructions. °· Will watch your condition. °· Will get help right away if you are not doing well or get worse. °Document Released: 11/16/2000 Document Revised: 11/24/2013 Document Reviewed: 09/11/2013 °[employer]® Patient Information ©2015 [employer]. This information is not intended to replace advice given to you by your health care provider. Make sure you discuss any questions you have with your health care provider. ° °

## 2014-06-23 NOTE — Progress Notes (Signed)
Patient is 23 y.o. [id number] by LMP cw 19w sono.  +FM, denies LOF, VB, contractions, vaginal discharge.  Overall feeling well.  Edema in hands/feet unchanged.  No HA/RUQ pain/visual changes.  -  Agrees to TDAP today - preE warnings discussed

## 2014-06-23 NOTE — Progress Notes (Signed)
Edema in hands and feet.  Patient defers tdap today-- wants to wait until next visit when husband is here.

## 2014-06-30 ENCOUNTER — Encounter: Payer: Self-pay | Admitting: Obstetrics and Gynecology

## 2014-06-30 ENCOUNTER — Ambulatory Visit (INDEPENDENT_AMBULATORY_CARE_PROVIDER_SITE_OTHER): Payer: Medicaid Other | Admitting: Obstetrics and Gynecology

## 2014-06-30 VITALS — BP 119/75 | HR 102 | Temp 98.3°F | Wt 193.4 lb

## 2014-06-30 DIAGNOSIS — Z3403 Encounter for supervision of normal first pregnancy, third trimester: Secondary | ICD-10-CM

## 2014-06-30 DIAGNOSIS — Z3400 Encounter for supervision of normal first pregnancy, unspecified trimester: Secondary | ICD-10-CM

## 2014-06-30 LAB — POCT URINALYSIS DIP (DEVICE)
BILIRUBIN URINE: NEGATIVE
Glucose, UA: NEGATIVE mg/dL
Ketones, ur: NEGATIVE mg/dL
NITRITE: NEGATIVE
PROTEIN: NEGATIVE mg/dL
Specific Gravity, Urine: 1.025 (ref 1.005–1.030)
Urobilinogen, UA: 1 mg/dL (ref 0.0–1.0)
pH: 6 (ref 5.0–8.0)

## 2014-06-30 NOTE — Progress Notes (Signed)
Patient reports pelvic pressure.

## 2014-06-30 NOTE — Progress Notes (Signed)
Pressure but no regular UCs. No LOF or VB. GFM. [facility] and understands payment policy.

## 2014-07-07 ENCOUNTER — Encounter: Payer: Self-pay | Admitting: Obstetrics and Gynecology

## 2014-07-07 ENCOUNTER — Ambulatory Visit (INDEPENDENT_AMBULATORY_CARE_PROVIDER_SITE_OTHER): Payer: Medicaid Other | Admitting: Obstetrics and Gynecology

## 2014-07-07 VITALS — BP 118/64 | HR 95 | Temp 98.3°F | Wt 194.9 lb

## 2014-07-07 DIAGNOSIS — Z3403 Encounter for supervision of normal first pregnancy, third trimester: Secondary | ICD-10-CM

## 2014-07-07 DIAGNOSIS — Z3400 Encounter for supervision of normal first pregnancy, unspecified trimester: Secondary | ICD-10-CM

## 2014-07-07 LAB — POCT URINALYSIS DIP (DEVICE)
Bilirubin Urine: NEGATIVE
Glucose, UA: NEGATIVE mg/dL
HGB URINE DIPSTICK: NEGATIVE
Ketones, ur: NEGATIVE mg/dL
Nitrite: NEGATIVE
Protein, ur: NEGATIVE mg/dL
SPECIFIC GRAVITY, URINE: 1.015 (ref 1.005–1.030)
Urobilinogen, UA: 1 mg/dL (ref 0.0–1.0)
pH: 7.5 (ref 5.0–8.0)

## 2014-07-07 NOTE — Progress Notes (Signed)
Doing well, some B-H. S/Sx labor and plans reviewed

## 2014-07-07 NOTE — Patient Instructions (Signed)
Contraception Choices Contraception (birth control) is the use of any methods or devices to prevent pregnancy. Below are some methods to help avoid pregnancy. HORMONAL METHODS   Contraceptive implant. This is a thin, plastic tube containing progesterone hormone. It does not contain estrogen hormone. Your health care provider inserts the tube in the inner part of the upper arm. The tube can remain in place for up to 3 years. After 3 years, the implant must be removed. The implant prevents the ovaries from releasing an egg (ovulation), thickens the cervical mucus to prevent sperm from entering the uterus, and thins the lining of the inside of the uterus.  Progesterone-only injections. These injections are given every 3 months by your health care provider to prevent pregnancy. This synthetic progesterone hormone stops the ovaries from releasing eggs. It also thickens cervical mucus and changes the uterine lining. This makes it harder for sperm to survive in the uterus.  Birth control pills. These pills contain estrogen and progesterone hormone. They work by preventing the ovaries from releasing eggs (ovulation). They also cause the cervical mucus to thicken, preventing the sperm from entering the uterus. Birth control pills are prescribed by a health care provider.Birth control pills can also be used to treat heavy periods.  Minipill. This type of birth control pill contains only the progesterone hormone. They are taken every day of each month and must be prescribed by your health care provider.  Birth control patch. The patch contains hormones similar to those in birth control pills. It must be changed once a week and is prescribed by a health care provider.  Vaginal ring. The ring contains hormones similar to those in birth control pills. It is left in the vagina for 3 weeks, removed for 1 week, and then a new one is put back in place. The patient must be comfortable inserting and removing the ring  from the vagina.A health care provider's prescription is necessary.  Emergency contraception. Emergency contraceptives prevent pregnancy after unprotected sexual intercourse. This pill can be taken right after sex or up to 5 days after unprotected sex. It is most effective the sooner you take the pills after having sexual intercourse. Most emergency contraceptive pills are available without a prescription. Check with your pharmacist. Do not use emergency contraception as your only form of birth control. BARRIER METHODS   Female condom. This is a thin sheath (latex or rubber) that is worn over the penis during sexual intercourse. It can be used with spermicide to increase effectiveness.  Female condom. This is a soft, loose-fitting sheath that is put into the vagina before sexual intercourse.  Diaphragm. This is a soft, latex, dome-shaped barrier that must be fitted by a health care provider. It is inserted into the vagina, along with a spermicidal jelly. It is inserted before intercourse. The diaphragm should be left in the vagina for 6 to 8 hours after intercourse.  Cervical cap. This is a round, soft, latex or plastic cup that fits over the cervix and must be fitted by a health care provider. The cap can be left in place for up to 48 hours after intercourse.  Sponge. This is a soft, circular piece of polyurethane foam. The sponge has spermicide in it. It is inserted into the vagina after wetting it and before sexual intercourse.  Spermicides. These are chemicals that kill or block sperm from entering the cervix and uterus. They come in the form of creams, jellies, suppositories, foam, or tablets. They do not require a   prescription. They are inserted into the vagina with an applicator before having sexual intercourse. The process must be repeated every time you have sexual intercourse. INTRAUTERINE CONTRACEPTION  Intrauterine device (IUD). This is a T-shaped device that is put in a woman's uterus  during a menstrual period to prevent pregnancy. There are 2 types:  Copper IUD. This type of IUD is wrapped in copper wire and is placed inside the uterus. Copper makes the uterus and fallopian tubes produce a fluid that kills sperm. It can stay in place for 10 years.  Hormone IUD. This type of IUD contains the hormone progestin (synthetic progesterone). The hormone thickens the cervical mucus and prevents sperm from entering the uterus, and it also thins the uterine lining to prevent implantation of a fertilized egg. The hormone can weaken or kill the sperm that get into the uterus. It can stay in place for 3-5 years, depending on which type of IUD is used. PERMANENT METHODS OF CONTRACEPTION  Female tubal ligation. This is when the woman's fallopian tubes are surgically sealed, tied, or blocked to prevent the egg from traveling to the uterus.  Hysteroscopic sterilization. This involves placing a small coil or insert into each fallopian tube. Your doctor uses a technique called hysteroscopy to do the procedure. The device causes scar tissue to form. This results in permanent blockage of the fallopian tubes, so the sperm cannot fertilize the egg. It takes about 3 months after the procedure for the tubes to become blocked. You must use another form of birth control for these 3 months.  Female sterilization. This is when the female has the tubes that carry sperm tied off (vasectomy).This blocks sperm from entering the vagina during sexual intercourse. After the procedure, the man can still ejaculate fluid (semen). NATURAL PLANNING METHODS  Natural family planning. This is not having sexual intercourse or using a barrier method (condom, diaphragm, cervical cap) on days the woman could become pregnant.  Calendar method. This is keeping track of the length of each menstrual cycle and identifying when you are fertile.  Ovulation method. This is avoiding sexual intercourse during ovulation.  Symptothermal  method. This is avoiding sexual intercourse during ovulation, using a thermometer and ovulation symptoms.  Post-ovulation method. This is timing sexual intercourse after you have ovulated. Regardless of which type or method of contraception you choose, it is important that you use condoms to protect against the transmission of sexually transmitted infections (STIs). Talk with your health care provider about which form of contraception is most appropriate for you. Document Released: 11/19/2005 Document Revised: 11/24/2013 Document Reviewed: 05/14/2013 [employer] Patient Information 2015 [employer]. This information is not intended to replace advice given to you by your health care provider. Make sure you discuss any questions you have with your health care provider.  

## 2014-07-12 ENCOUNTER — Encounter (HOSPITAL_COMMUNITY): Payer: Self-pay | Admitting: General Practice

## 2014-07-12 ENCOUNTER — Inpatient Hospital Stay (HOSPITAL_COMMUNITY)
Admission: AD | Admit: 2014-07-12 | Discharge: 2014-07-12 | Disposition: A | Discharge status: Home / Self Care | Payer: Medicaid Other | Source: Ambulatory Visit | Attending: Obstetrics & Gynecology | Admitting: Obstetrics & Gynecology

## 2014-07-12 DIAGNOSIS — R109 Unspecified abdominal pain: Secondary | ICD-10-CM | POA: Insufficient documentation

## 2014-07-12 DIAGNOSIS — O479 False labor, unspecified: Secondary | ICD-10-CM | POA: Insufficient documentation

## 2014-07-12 NOTE — Discharge Instructions (Signed)
Braxton Hicks Contractions °Contractions of the uterus can occur throughout pregnancy. Contractions are not always a sign that you are in labor.  °WHAT ARE BRAXTON HICKS CONTRACTIONS?  °Contractions that occur before labor are called Braxton Hicks contractions, or false labor. Toward the end of pregnancy (32-34 weeks), these contractions can develop more often and may become more forceful. This is not true labor because these contractions do not result in opening (dilatation) and thinning of the cervix. They are sometimes difficult to tell apart from true labor because these contractions can be forceful and people have different pain tolerances. You should not feel embarrassed if you go to the hospital with false labor. Sometimes, the only way to tell if you are in true labor is for your health care provider to look for changes in the cervix. °If there are no prenatal problems or other health problems associated with the pregnancy, it is completely safe to be sent home with false labor and await the onset of true labor. °HOW CAN YOU TELL THE DIFFERENCE BETWEEN TRUE AND FALSE LABOR? °False Labor °· The contractions of false labor are usually shorter and not as hard as those of true labor.   °· The contractions are usually irregular.   °· The contractions are often felt in the front of the lower abdomen and in the groin.   °· The contractions may go away when you walk around or change positions while lying down.   °· The contractions get weaker and are shorter lasting as time goes on.   °· The contractions do not usually become progressively stronger, regular, and closer together as with true labor.   °True Labor °· Contractions in true labor last 30-70 seconds, become very regular, usually become more intense, and increase in frequency.   °· The contractions do not go away with walking.   °· The discomfort is usually felt in the top of the uterus and spreads to the lower abdomen and low back.   °· True labor can be  determined by your health care provider with an exam. This will show that the cervix is dilating and getting thinner.   °WHAT TO REMEMBER °· Keep up with your usual exercises and follow other instructions given by your health care provider.   °· Take medicines as directed by your health care provider.   °· Keep your regular prenatal appointments.   °· Eat and drink lightly if you think you are going into labor.   °· If Braxton Hicks contractions are making you uncomfortable:   °¨ Change your position from lying down or resting to walking, or from walking to resting.   °¨ Sit and rest in a tub of warm water.   °¨ Drink 2-3 glasses of water. Dehydration may cause these contractions.   °¨ Do slow and deep breathing several times an hour.   °WHEN SHOULD I SEEK IMMEDIATE MEDICAL CARE? °Seek immediate medical care if: °· Your contractions become stronger, more regular, and closer together.   °· You have fluid leaking or gushing from your vagina.   °· You have a fever.   °· You pass blood-tinged mucus.   °· You have vaginal bleeding.   °· You have continuous abdominal pain.   °· You have low back pain that you never had before.   °· You feel your baby's head pushing down and causing pelvic pressure.   °· Your baby is not moving as much as it used to.   °Document Released: 11/19/2005 Document Revised: 11/24/2013 Document Reviewed: 08/31/2013 °[employer]® Patient Information ©2015 [employer]. This information is not intended to replace advice given to you by your health care   provider. Make sure you discuss any questions you have with your health care provider. ° °

## 2014-07-12 NOTE — MAU Note (Signed)
2.5 cm's in clinic last week.

## 2014-07-12 NOTE — MAU Note (Signed)
Couildn't sleep last night, lower abd pain comes & goes for the past 2-3 days.  Denies bleeding or LOF.

## 2014-07-12 NOTE — MAU Provider Note (Signed)
Chief Complaint:  Abdominal Pain  MD first at bedside and first contact w/ pt on 8/10 @ 1815    HPI: Samantha Ortiz is a 23 y.o. G1P0000 at 178w6d who presents to maternity admissions reporting abdominal pain.  Pt reports last PM, she noted cramping, "like period cramps" which started in her back and moved around to her stomach. Reports they were mild, 3/10, and she was able to talk and walk through them. Given she is close to term, she was worried this wasn't normal and presented to MAU for further eval.  Denies contractions, leakage of fluid or vaginal bleeding. Good fetal movement.   Pregnancy Course:  Uncomplicated  Past Medical History: History reviewed. No pertinent past medical history.  Past obstetric history: OB History  Gravida Para Term Preterm AB SAB TAB Ectopic Multiple Living  1 0 0 0 0 0 0 0 0 0     # Outcome Date GA Lbr Len/2nd Weight Sex Delivery Anes PTL Lv  1 CUR               Past Surgical History: History reviewed. No pertinent past surgical history.   Family History: History reviewed. No pertinent family history.  Social History: History  Substance Use Topics  . Smoking status: Never Smoker   . Smokeless tobacco: Never Used  . Alcohol Use: No    Allergies: No Known Allergies  Meds:  No prescriptions prior to admission    ROS: Pertinent findings in history of present illness.  Physical Exam  Blood pressure 128/68, pulse 96, temperature 98 F (36.7 C), temperature source Oral, resp. rate 18, last menstrual period 10/06/2013. GENERAL: Well-developed, well-nourished female in no acute distress.  HEENT: normocephalic HEART: normal rate RESP: normal effort ABDOMEN: Soft, non-tender, gravid appropriate for gestational age EXTREMITIES: Nontender, no edema NEURO: alert and oriented SPECULUM EXAM: NEFG, physiologic discharge, no blood, cervix clean Dilation: 2 Effacement (%): 70 Cervical Position: Posterior Station: -2 Presentation: Vertex Exam  by:: Dr. Ethelda Chickaroline Alyxandra Tenbrink, MD   FHT:  Baseline 140s , moderate variability, accelerations present, no decelerations Contractions: Irregular   Labs: No results found for this or any previous visit (from the past 24 hour(s)).  Imaging:  No results found. MAU Course: Pt presents with mild abdominal cramping, appears to be c/w Braxton hicks contractions. Cat I strip, reassuring FHTs. Occasional irregular contractions on toco. Vaginal exam unchanged from clinic earlier this week, from 2.5/50/ballotable to 2/70/ballotable.  Discussed labor precautions, reassured.  Assessment: 1. False labor     Plan: Discharge home Labor precautions and fetal kick counts     Follow-up Information   Please follow up. (If symptoms worsen)        Medication List    Notice   You have not been prescribed any medications.      Ethelda Chickaroline Rei Medlen, MD 07/12/2014 9:02 PM

## 2014-07-19 ENCOUNTER — Ambulatory Visit (INDEPENDENT_AMBULATORY_CARE_PROVIDER_SITE_OTHER): Payer: Medicaid Other | Admitting: Family Medicine

## 2014-07-19 VITALS — BP 124/80 | HR 91 | Temp 97.8°F | Wt 198.7 lb

## 2014-07-19 DIAGNOSIS — O480 Post-term pregnancy: Secondary | ICD-10-CM

## 2014-07-19 DIAGNOSIS — Z3403 Encounter for supervision of normal first pregnancy, third trimester: Secondary | ICD-10-CM

## 2014-07-19 DIAGNOSIS — Z3400 Encounter for supervision of normal first pregnancy, unspecified trimester: Secondary | ICD-10-CM

## 2014-07-19 LAB — US OB FOLLOW UP

## 2014-07-19 LAB — POCT URINALYSIS DIP (DEVICE)
BILIRUBIN URINE: NEGATIVE
Bilirubin Urine: NEGATIVE
GLUCOSE, UA: NEGATIVE mg/dL
Glucose, UA: NEGATIVE mg/dL
HGB URINE DIPSTICK: NEGATIVE
KETONES UR: NEGATIVE mg/dL
Ketones, ur: NEGATIVE mg/dL
Nitrite: NEGATIVE
Nitrite: NEGATIVE
PH: 5.5 (ref 5.0–8.0)
Protein, ur: NEGATIVE mg/dL
Protein, ur: NEGATIVE mg/dL
SPECIFIC GRAVITY, URINE: 1.02 (ref 1.005–1.030)
Specific Gravity, Urine: 1.02 (ref 1.005–1.030)
UROBILINOGEN UA: 0.2 mg/dL (ref 0.0–1.0)
Urobilinogen, UA: 0.2 mg/dL (ref 0.0–1.0)
pH: 5.5 (ref 5.0–8.0)

## 2014-07-19 NOTE — Patient Instructions (Signed)
Third Trimester of Pregnancy The third trimester is from week 29 through week 42, months 7 through 9. The third trimester is a time when the fetus is growing rapidly. At the end of the ninth month, the fetus is about 20 inches in length and weighs 6-10 pounds.  BODY CHANGES Your body goes through many changes during pregnancy. The changes vary from woman to woman.   Your weight will continue to increase. You can expect to gain 25-35 pounds (11-16 kg) by the end of the pregnancy.  You may begin to get stretch marks on your hips, abdomen, and breasts.  You may urinate more often because the fetus is moving lower into your pelvis and pressing on your bladder.  You may develop or continue to have heartburn as a result of your pregnancy.  You may develop constipation because certain hormones are causing the muscles that push waste through your intestines to slow down.  You may develop hemorrhoids or swollen, bulging veins (varicose veins).  You may have pelvic pain because of the weight gain and pregnancy hormones relaxing your joints between the bones in your pelvis. Backaches may result from overexertion of the muscles supporting your posture.  You may have changes in your hair. These can include thickening of your hair, rapid growth, and changes in texture. Some women also have hair loss during or after pregnancy, or hair that feels dry or thin. Your hair will most likely return to normal after your baby is born.  Your breasts will continue to grow and be tender. A yellow discharge may leak from your breasts called colostrum.  Your belly button may stick out.  You may feel short of breath because of your expanding uterus.  You may notice the fetus "dropping," or moving lower in your abdomen.  You may have a bloody mucus discharge. This usually occurs a few days to a week before labor begins.  Your cervix becomes thin and soft (effaced) near your due date. WHAT TO EXPECT AT YOUR  PRENATAL EXAMS  You will have prenatal exams every 2 weeks until week 36. Then, you will have weekly prenatal exams. During a routine prenatal visit:  You will be weighed to make sure you and the fetus are growing normally.  Your blood pressure is taken.  Your abdomen will be measured to track your baby's growth.  The fetal heartbeat will be listened to.  Any test results from the previous visit will be discussed.  You may have a cervical check near your due date to see if you have effaced. At around 36 weeks, your caregiver will check your cervix. At the same time, your caregiver will also perform a test on the secretions of the vaginal tissue. This test is to determine if a type of bacteria, Group B streptococcus, is present. Your caregiver will explain this further. Your caregiver may ask you:  What your birth plan is.  How you are feeling.  If you are feeling the baby move.  If you have had any abnormal symptoms, such as leaking fluid, bleeding, severe headaches, or abdominal cramping.  If you have any questions. Other tests or screenings that may be performed during your third trimester include:  Blood tests that check for low iron levels (anemia).  Fetal testing to check the health, activity level, and growth of the fetus. Testing is done if you have certain medical conditions or if there are problems during the pregnancy. FALSE LABOR You may feel small, irregular contractions that   eventually go away. These are called Braxton Hicks contractions, or false labor. Contractions may last for hours, days, or even weeks before true labor sets in. If contractions come at regular intervals, intensify, or become painful, it is best to be seen by your caregiver.  SIGNS OF LABOR   Menstrual-like cramps.  Contractions that are 5 minutes apart or less.  Contractions that start on the top of the uterus and spread down to the lower abdomen and back.  A sense of increased pelvic  pressure or back pain.  A watery or bloody mucus discharge that comes from the vagina. If you have any of these signs before the 37th week of pregnancy, call your caregiver right away. You need to go to the hospital to get checked immediately. HOME CARE INSTRUCTIONS   Avoid all smoking, herbs, alcohol, and unprescribed drugs. These chemicals affect the formation and growth of the baby.  Follow your caregiver's instructions regarding medicine use. There are medicines that are either safe or unsafe to take during pregnancy.  Exercise only as directed by your caregiver. Experiencing uterine cramps is a good sign to stop exercising.  Continue to eat regular, healthy meals.  Wear a good support bra for breast tenderness.  Do not use hot tubs, steam rooms, or saunas.  Wear your seat belt at all times when driving.  Avoid raw meat, uncooked cheese, cat litter boxes, and soil used by cats. These carry germs that can cause birth defects in the baby.  Take your prenatal vitamins.  Try taking a stool softener (if your caregiver approves) if you develop constipation. Eat more high-fiber foods, such as fresh vegetables or fruit and whole grains. Drink plenty of fluids to keep your urine clear or pale yellow.  Take warm sitz baths to soothe any pain or discomfort caused by hemorrhoids. Use hemorrhoid cream if your caregiver approves.  If you develop varicose veins, wear support hose. Elevate your feet for 15 minutes, 3-4 times a day. Limit salt in your diet.  Avoid heavy lifting, wear low heal shoes, and practice good posture.  Rest a lot with your legs elevated if you have leg cramps or low back pain.  Visit your dentist if you have not gone during your pregnancy. Use a soft toothbrush to brush your teeth and be gentle when you floss.  A sexual relationship may be continued unless your caregiver directs you otherwise.  Do not travel far distances unless it is absolutely necessary and only  with the approval of your caregiver.  Take prenatal classes to understand, practice, and ask questions about the labor and delivery.  Make a trial run to the hospital.  Pack your hospital bag.  Prepare the baby's nursery.  Continue to go to all your prenatal visits as directed by your caregiver. SEEK MEDICAL CARE IF:  You are unsure if you are in labor or if your water has broken.  You have dizziness.  You have mild pelvic cramps, pelvic pressure, or nagging pain in your abdominal area.  You have persistent nausea, vomiting, or diarrhea.  You have a bad smelling vaginal discharge.  You have pain with urination. SEEK IMMEDIATE MEDICAL CARE IF:   You have a fever.  You are leaking fluid from your vagina.  You have spotting or bleeding from your vagina.  You have severe abdominal cramping or pain.  You have rapid weight loss or gain.  You have shortness of breath with chest pain.  You notice sudden or extreme swelling   of your face, hands, ankles, feet, or legs.  You have not felt your baby move in over an hour.  You have severe headaches that do not go away with medicine.  You have vision changes. Document Released: 11/13/2001 Document Revised: 11/24/2013 Document Reviewed: 01/20/2013 [employer] Patient Information 2015 [employer]. This information is not intended to replace advice given to you by your health care provider. Make sure you discuss any questions you have with your health care provider.  Breastfeeding Deciding to breastfeed is one of the best choices you can make for you and your baby. A change in hormones during pregnancy causes your breast tissue to grow and increases the number and size of your milk ducts. These hormones also allow proteins, sugars, and fats from your blood supply to make breast milk in your milk-producing glands. Hormones prevent breast milk from being released before your baby is born as well as prompt milk flow after birth. Once  breastfeeding has begun, thoughts of your baby, as well as his or her sucking or crying, can stimulate the release of milk from your milk-producing glands.  BENEFITS OF BREASTFEEDING For Your Baby  Your first milk (colostrum) helps your baby's digestive system function better.   There are antibodies in your milk that help your baby fight off infections.   Your baby has a lower incidence of asthma, allergies, and sudden infant death syndrome.   The nutrients in breast milk are better for your baby than infant formulas and are designed uniquely for your baby's needs.   Breast milk improves your baby's brain development.   Your baby is less likely to develop other conditions, such as childhood obesity, asthma, or type 2 diabetes mellitus.  For You   Breastfeeding helps to create a very special bond between you and your baby.   Breastfeeding is convenient. Breast milk is always available at the correct temperature and costs nothing.   Breastfeeding helps to burn calories and helps you lose the weight gained during pregnancy.   Breastfeeding makes your uterus contract to its prepregnancy size faster and slows bleeding (lochia) after you give birth.   Breastfeeding helps to lower your risk of developing type 2 diabetes mellitus, osteoporosis, and breast or ovarian cancer later in life. SIGNS THAT YOUR BABY IS HUNGRY Early Signs of Hunger  Increased alertness or activity.  Stretching.  Movement of the head from side to side.  Movement of the head and opening of the mouth when the corner of the mouth or cheek is stroked (rooting).  Increased sucking sounds, smacking lips, cooing, sighing, or squeaking.  Hand-to-mouth movements.  Increased sucking of fingers or hands. Late Signs of Hunger  Fussing.  Intermittent crying. Extreme Signs of Hunger Signs of extreme hunger will require calming and consoling before your baby will be able to breastfeed successfully. Do not  wait for the following signs of extreme hunger to occur before you initiate breastfeeding:   Restlessness.  A loud, strong cry.   Screaming. BREASTFEEDING BASICS Breastfeeding Initiation  Find a comfortable place to sit or lie down, with your neck and back well supported.  Place a pillow or rolled up blanket under your baby to bring him or her to the level of your breast (if you are seated). Nursing pillows are specially designed to help support your arms and your baby while you breastfeed.  Make sure that your baby's abdomen is facing your abdomen.   Gently massage your breast. With your fingertips, massage from your chest   wall toward your nipple in a circular motion. This encourages milk flow. You may need to continue this action during the feeding if your milk flows slowly.  Support your breast with 4 fingers underneath and your thumb above your nipple. Make sure your fingers are well away from your nipple and your baby's mouth.   Stroke your baby's lips gently with your finger or nipple.   When your baby's mouth is open wide enough, quickly bring your baby to your breast, placing your entire nipple and as much of the colored area around your nipple (areola) as possible into your baby's mouth.   More areola should be visible above your baby's upper lip than below the lower lip.   Your baby's tongue should be between his or her lower gum and your breast.   Ensure that your baby's mouth is correctly positioned around your nipple (latched). Your baby's lips should create a seal on your breast and be turned out (everted).  It is common for your baby to suck about 2-3 minutes in order to start the flow of breast milk. Latching Teaching your baby how to latch on to your breast properly is very important. An improper latch can cause nipple pain and decreased milk supply for you and poor weight gain in your baby. Also, if your baby is not latched onto your nipple properly, he or she  may swallow some air during feeding. This can make your baby fussy. Burping your baby when you switch breasts during the feeding can help to get rid of the air. However, teaching your baby to latch on properly is still the best way to prevent fussiness from swallowing air while breastfeeding. Signs that your baby has successfully latched on to your nipple:    Silent tugging or silent sucking, without causing you pain.   Swallowing heard between every 3-4 sucks.    Muscle movement above and in front of his or her ears while sucking.  Signs that your baby has not successfully latched on to nipple:   Sucking sounds or smacking sounds from your baby while breastfeeding.  Nipple pain. If you think your baby has not latched on correctly, slip your finger into the corner of your baby's mouth to break the suction and place it between your baby's gums. Attempt breastfeeding initiation again. Signs of Successful Breastfeeding Signs from your baby:   A gradual decrease in the number of sucks or complete cessation of sucking.   Falling asleep.   Relaxation of his or her body.   Retention of a small amount of milk in his or her mouth.   Letting go of your breast by himself or herself. Signs from you:  Breasts that have increased in firmness, weight, and size 1-3 hours after feeding.   Breasts that are softer immediately after breastfeeding.  Increased milk volume, as well as a change in milk consistency and color by the fifth day of breastfeeding.   Nipples that are not sore, cracked, or bleeding. Signs That Your Baby is Getting Enough Milk  Wetting at least 3 diapers in a 24-hour period. The urine should be clear and pale yellow by age 5 days.  At least 3 stools in a 24-hour period by age 5 days. The stool should be soft and yellow.  At least 3 stools in a 24-hour period by age 7 days. The stool should be seedy and yellow.  No loss of weight greater than 10% of birth weight  during the first 3   days of age.  Average weight gain of 4-7 ounces (113-198 g) per week after age 4 days.  Consistent daily weight gain by age 5 days, without weight loss after the age of 2 weeks. After a feeding, your baby may spit up a small amount. This is common. BREASTFEEDING FREQUENCY AND DURATION Frequent feeding will help you make more milk and can prevent sore nipples and breast engorgement. Breastfeed when you feel the need to reduce the fullness of your breasts or when your baby shows signs of hunger. This is called "breastfeeding on demand." Avoid introducing a pacifier to your baby while you are working to establish breastfeeding (the first 4-6 weeks after your baby is born). After this time you may choose to use a pacifier. Research has shown that pacifier use during the first year of a baby's life decreases the risk of sudden infant death syndrome (SIDS). Allow your baby to feed on each breast as long as he or she wants. Breastfeed until your baby is finished feeding. When your baby unlatches or falls asleep while feeding from the first breast, offer the second breast. Because newborns are often sleepy in the first few weeks of life, you may need to awaken your baby to get him or her to feed. Breastfeeding times will vary from baby to baby. However, the following rules can serve as a guide to help you ensure that your baby is properly fed:  Newborns (babies 4 weeks of age or younger) may breastfeed every 1-3 hours.  Newborns should not go longer than 3 hours during the day or 5 hours during the night without breastfeeding.  You should breastfeed your baby a minimum of 8 times in a 24-hour period until you begin to introduce solid foods to your baby at around 6 months of age. BREAST MILK PUMPING Pumping and storing breast milk allows you to ensure that your baby is exclusively fed your breast milk, even at times when you are unable to breastfeed. This is especially important if you are  going back to work while you are still breastfeeding or when you are not able to be present during feedings. Your lactation consultant can give you guidelines on how long it is safe to store breast milk.  A breast pump is a machine that allows you to pump milk from your breast into a sterile bottle. The pumped breast milk can then be stored in a refrigerator or freezer. Some breast pumps are operated by hand, while others use electricity. Ask your lactation consultant which type will work best for you. Breast pumps can be purchased, but some hospitals and breastfeeding support groups lease breast pumps on a monthly basis. A lactation consultant can teach you how to hand express breast milk, if you prefer not to use a pump.  CARING FOR YOUR BREASTS WHILE YOU BREASTFEED Nipples can become dry, cracked, and sore while breastfeeding. The following recommendations can help keep your breasts moisturized and healthy:  Avoid using soap on your nipples.   Wear a supportive bra. Although not required, special nursing bras and tank tops are designed to allow access to your breasts for breastfeeding without taking off your entire bra or top. Avoid wearing underwire-style bras or extremely tight bras.  Air dry your nipples for 3-4minutes after each feeding.   Use only cotton bra pads to absorb leaked breast milk. Leaking of breast milk between feedings is normal.   Use lanolin on your nipples after breastfeeding. Lanolin helps to maintain your skin's   normal moisture barrier. If you use pure lanolin, you do not need to wash it off before feeding your baby again. Pure lanolin is not toxic to your baby. You may also hand express a few drops of breast milk and gently massage that milk into your nipples and allow the milk to air dry. In the first few weeks after giving birth, some women experience extremely full breasts (engorgement). Engorgement can make your breasts feel heavy, warm, and tender to the touch.  Engorgement peaks within 3-5 days after you give birth. The following recommendations can help ease engorgement:  Completely empty your breasts while breastfeeding or pumping. You may want to start by applying warm, moist heat (in the shower or with warm water-soaked hand towels) just before feeding or pumping. This increases circulation and helps the milk flow. If your baby does not completely empty your breasts while breastfeeding, pump any extra milk after he or she is finished.  Wear a snug bra (nursing or regular) or tank top for 1-2 days to signal your body to slightly decrease milk production.  Apply ice packs to your breasts, unless this is too uncomfortable for you.  Make sure that your baby is latched on and positioned properly while breastfeeding. If engorgement persists after 48 hours of following these recommendations, contact your health care provider or a lactation consultant. OVERALL HEALTH CARE RECOMMENDATIONS WHILE BREASTFEEDING  Eat healthy foods. Alternate between meals and snacks, eating 3 of each per day. Because what you eat affects your breast milk, some of the foods may make your baby more irritable than usual. Avoid eating these foods if you are sure that they are negatively affecting your baby.  Drink milk, fruit juice, and water to satisfy your thirst (about 10 glasses a day).   Rest often, relax, and continue to take your prenatal vitamins to prevent fatigue, stress, and anemia.  Continue breast self-awareness checks.  Avoid chewing and smoking tobacco.  Avoid alcohol and drug use. Some medicines that may be harmful to your baby can pass through breast milk. It is important to ask your health care provider before taking any medicine, including all over-the-counter and prescription medicine as well as vitamin and herbal supplements. It is possible to become pregnant while breastfeeding. If birth control is desired, ask your health care provider about options that  will be safe for your baby. SEEK MEDICAL CARE IF:   You feel like you want to stop breastfeeding or have become frustrated with breastfeeding.  You have painful breasts or nipples.  Your nipples are cracked or bleeding.  Your breasts are red, tender, or warm.  You have a swollen area on either breast.  You have a fever or chills.  You have nausea or vomiting.  You have drainage other than breast milk from your nipples.  Your breasts do not become full before feedings by the fifth day after you give birth.  You feel sad and depressed.  Your baby is too sleepy to eat well.  Your baby is having trouble sleeping.   Your baby is wetting less than 3 diapers in a 24-hour period.  Your baby has less than 3 stools in a 24-hour period.  Your baby's skin or the white part of his or her eyes becomes yellow.   Your baby is not gaining weight by 5 days of age. SEEK IMMEDIATE MEDICAL CARE IF:   Your baby is overly tired (lethargic) and does not want to wake up and feed.  Your baby   develops an unexplained fever. Document Released: 11/19/2005 Document Revised: 11/24/2013 Document Reviewed: 05/13/2013 [employer] Patient Information 2015 [employer]. This information is not intended to replace advice given to you by your health care provider. Make sure you discuss any questions you have with your health care provider.  

## 2014-07-19 NOTE — Progress Notes (Signed)
Needs IOL scheduled Needs NST---NST reviewed and reactive. Declines cervical check today

## 2014-07-19 NOTE — Progress Notes (Signed)
Reports occasional edema in feet and intermittent pelvic pressure, no pain or contractions.

## 2014-07-20 ENCOUNTER — Encounter (HOSPITAL_COMMUNITY): Payer: Medicaid Other | Admitting: Anesthesiology

## 2014-07-20 ENCOUNTER — Inpatient Hospital Stay (HOSPITAL_COMMUNITY)
Admission: AD | Admit: 2014-07-20 | Discharge: 2014-07-22 | DRG: 775 | Disposition: A | Discharge status: Home / Self Care | Payer: Medicaid Other | Source: Ambulatory Visit | Attending: Family Medicine | Admitting: Family Medicine

## 2014-07-20 ENCOUNTER — Inpatient Hospital Stay (HOSPITAL_COMMUNITY): Payer: Medicaid Other | Admitting: Anesthesiology

## 2014-07-20 ENCOUNTER — Encounter (HOSPITAL_COMMUNITY): Payer: Self-pay | Admitting: *Deleted

## 2014-07-20 DIAGNOSIS — Z3403 Encounter for supervision of normal first pregnancy, third trimester: Secondary | ICD-10-CM

## 2014-07-20 DIAGNOSIS — O9903 Anemia complicating the puerperium: Secondary | ICD-10-CM | POA: Diagnosis present

## 2014-07-20 DIAGNOSIS — D649 Anemia, unspecified: Secondary | ICD-10-CM | POA: Diagnosis not present

## 2014-07-20 DIAGNOSIS — O479 False labor, unspecified: Secondary | ICD-10-CM | POA: Diagnosis present

## 2014-07-20 HISTORY — DX: Other specified health status: Z78.9

## 2014-07-20 LAB — CBC
HCT: 30.7 % — ABNORMAL LOW (ref 36.0–46.0)
HEMATOCRIT: 35.4 % — AB (ref 36.0–46.0)
HEMOGLOBIN: 11.7 g/dL — AB (ref 12.0–15.0)
HEMOGLOBIN: 10.1 g/dL — AB (ref 12.0–15.0)
MCH: 26.7 pg (ref 26.0–34.0)
MCH: 26.5 pg (ref 26.0–34.0)
MCHC: 33.1 g/dL (ref 30.0–36.0)
MCHC: 32.9 g/dL (ref 30.0–36.0)
MCV: 80.8 fL (ref 78.0–100.0)
MCV: 80.6 fL (ref 78.0–100.0)
Platelets: 184 10*3/uL (ref 150–400)
Platelets: 172 10*3/uL (ref 150–400)
RBC: 4.38 MIL/uL (ref 3.87–5.11)
RBC: 3.81 MIL/uL — ABNORMAL LOW (ref 3.87–5.11)
RDW: 14.8 % (ref 11.5–15.5)
RDW: 15 % (ref 11.5–15.5)
WBC: 17.7 10*3/uL — ABNORMAL HIGH (ref 4.0–10.5)
WBC: 30.4 10*3/uL — AB (ref 4.0–10.5)

## 2014-07-20 LAB — TYPE AND SCREEN
ABO/RH(D): A POS
Antibody Screen: NEGATIVE

## 2014-07-20 LAB — SAMPLE TO BLOOD BANK

## 2014-07-20 LAB — RPR

## 2014-07-20 LAB — ABO/RH: ABO/RH(D): A POS

## 2014-07-20 MED ORDER — METHYLERGONOVINE MALEATE 0.2 MG/ML IJ SOLN
INTRAMUSCULAR | Status: AC
  Administered 2014-07-20: 0.2 mg
  Filled 2014-07-20: qty 1

## 2014-07-20 MED ORDER — FENTANYL CITRATE 0.05 MG/ML IJ SOLN: 100.0000 ug | Freq: Once | INTRAMUSCULAR | Status: DC

## 2014-07-20 MED ORDER — SENNOSIDES-DOCUSATE SODIUM 8.6-50 MG PO TABS
2.0000 | ORAL_TABLET | ORAL | Status: DC
  Administered 2014-07-21 – 2014-07-22 (×2): 2 via ORAL
  Filled 2014-07-20 (×2): qty 2

## 2014-07-20 MED ORDER — ACETAMINOPHEN 325 MG PO TABS: 650.0000 mg | ORAL_TABLET | ORAL | Status: DC | PRN

## 2014-07-20 MED ORDER — SIMETHICONE 80 MG PO CHEW: 80.0000 mg | CHEWABLE_TABLET | ORAL | Status: DC | PRN

## 2014-07-20 MED ORDER — ONDANSETRON HCL 4 MG/2ML IJ SOLN
4.0000 mg | Freq: Four times a day (QID) | INTRAMUSCULAR | Status: DC | PRN
Start: 2014-07-20 — End: 2014-07-20

## 2014-07-20 MED ORDER — METHYLERGONOVINE MALEATE 0.2 MG/ML IJ SOLN: 0.2000 mg | Freq: Once | INTRAMUSCULAR | Status: DC

## 2014-07-20 MED ORDER — LIDOCAINE HCL (PF) 1 % IJ SOLN
30.0000 mL | INTRAMUSCULAR | Status: DC | PRN
  Administered 2014-07-20: 30 mL via SUBCUTANEOUS
  Filled 2014-07-20: qty 30

## 2014-07-20 MED ORDER — OXYTOCIN 40 UNITS IN LACTATED RINGERS INFUSION - SIMPLE MED
62.5000 mL/h | INTRAVENOUS | Status: DC
  Administered 2014-07-20: 999 mL/h via INTRAVENOUS
  Filled 2014-07-20: qty 1000

## 2014-07-20 MED ORDER — ONDANSETRON HCL 4 MG PO TABS: 4.0000 mg | ORAL_TABLET | ORAL | Status: DC | PRN

## 2014-07-20 MED ORDER — PHENYLEPHRINE 40 MCG/ML (10ML) SYRINGE FOR IV PUSH (FOR BLOOD PRESSURE SUPPORT)
80.0000 ug | PREFILLED_SYRINGE | INTRAVENOUS | Status: DC | PRN
  Filled 2014-07-20: qty 2
  Filled 2014-07-20: qty 10

## 2014-07-20 MED ORDER — PHENYLEPHRINE 40 MCG/ML (10ML) SYRINGE FOR IV PUSH (FOR BLOOD PRESSURE SUPPORT)
80.0000 ug | PREFILLED_SYRINGE | INTRAVENOUS | Status: DC | PRN
  Filled 2014-07-20: qty 2

## 2014-07-20 MED ORDER — OXYCODONE-ACETAMINOPHEN 5-325 MG PO TABS
1.0000 | ORAL_TABLET | ORAL | Status: DC | PRN
  Filled 2014-07-20: qty 2

## 2014-07-20 MED ORDER — FENTANYL CITRATE 0.05 MG/ML IJ SOLN
100.0000 ug | INTRAMUSCULAR | Status: DC | PRN
  Administered 2014-07-20 (×2): 100 ug via INTRAVENOUS
  Filled 2014-07-20 (×2): qty 2

## 2014-07-20 MED ORDER — MISOPROSTOL 200 MCG PO TABS: 800.0000 ug | ORAL_TABLET | Freq: Once | ORAL | Status: DC

## 2014-07-20 MED ORDER — FENTANYL 2.5 MCG/ML BUPIVACAINE 1/10 % EPIDURAL INFUSION (WH - ANES): 14.0000 mL/h | INTRAMUSCULAR | Status: DC | PRN

## 2014-07-20 MED ORDER — EPHEDRINE 5 MG/ML INJ
10.0000 mg | INTRAVENOUS | Status: DC | PRN
  Filled 2014-07-20: qty 2

## 2014-07-20 MED ORDER — DIPHENHYDRAMINE HCL 25 MG PO CAPS: 25.0000 mg | ORAL_CAPSULE | Freq: Four times a day (QID) | ORAL | Status: DC | PRN

## 2014-07-20 MED ORDER — LACTATED RINGERS IV SOLN
INTRAVENOUS | Status: DC
  Administered 2014-07-20 (×2): via INTRAVENOUS

## 2014-07-20 MED ORDER — ONDANSETRON HCL 4 MG/2ML IJ SOLN: 4.0000 mg | INTRAMUSCULAR | Status: DC | PRN

## 2014-07-20 MED ORDER — LACTATED RINGERS IV SOLN: 500.0000 mL | INTRAVENOUS | Status: DC | PRN

## 2014-07-20 MED ORDER — WITCH HAZEL-GLYCERIN EX PADS: 1.0000 "application " | MEDICATED_PAD | CUTANEOUS | Status: DC | PRN

## 2014-07-20 MED ORDER — LIDOCAINE HCL (PF) 1 % IJ SOLN
INTRAMUSCULAR | Status: DC | PRN
  Administered 2014-07-20: 10 mL

## 2014-07-20 MED ORDER — ZOLPIDEM TARTRATE 5 MG PO TABS
5.0000 mg | ORAL_TABLET | Freq: Every evening | ORAL | Status: DC | PRN
Start: 2014-07-20 — End: 2014-07-22

## 2014-07-20 MED ORDER — FENTANYL 2.5 MCG/ML BUPIVACAINE 1/10 % EPIDURAL INFUSION (WH - ANES)
14.0000 mL/h | INTRAMUSCULAR | Status: DC | PRN
  Administered 2014-07-20: 14 mL/h via EPIDURAL
  Filled 2014-07-20: qty 125

## 2014-07-20 MED ORDER — TETANUS-DIPHTH-ACELL PERTUSSIS 5-2.5-18.5 LF-MCG/0.5 IM SUSP: 0.5000 mL | Freq: Once | INTRAMUSCULAR | Status: DC

## 2014-07-20 MED ORDER — LANOLIN HYDROUS EX OINT: TOPICAL_OINTMENT | CUTANEOUS | Status: DC | PRN

## 2014-07-20 MED ORDER — MISOPROSTOL 200 MCG PO TABS
ORAL_TABLET | ORAL | Status: AC
  Filled 2014-07-20: qty 5

## 2014-07-20 MED ORDER — PRENATAL MULTIVITAMIN CH
1.0000 | ORAL_TABLET | Freq: Every day | ORAL | Status: DC
  Administered 2014-07-21 – 2014-07-22 (×2): 1 via ORAL
  Filled 2014-07-20 (×2): qty 1

## 2014-07-20 MED ORDER — OXYTOCIN BOLUS FROM INFUSION: 500.0000 mL | INTRAVENOUS | Status: DC

## 2014-07-20 MED ORDER — IBUPROFEN 600 MG PO TABS
600.0000 mg | ORAL_TABLET | Freq: Four times a day (QID) | ORAL | Status: DC
  Administered 2014-07-21 – 2014-07-22 (×6): 600 mg via ORAL
  Filled 2014-07-20 (×7): qty 1

## 2014-07-20 MED ORDER — DIPHENHYDRAMINE HCL 50 MG/ML IJ SOLN: 12.5000 mg | INTRAMUSCULAR | Status: DC | PRN

## 2014-07-20 MED ORDER — BENZOCAINE-MENTHOL 20-0.5 % EX AERO
1.0000 "application " | INHALATION_SPRAY | CUTANEOUS | Status: DC | PRN
  Filled 2014-07-20 (×2): qty 56

## 2014-07-20 MED ORDER — DIBUCAINE 1 % RE OINT: 1.0000 "application " | TOPICAL_OINTMENT | RECTAL | Status: DC | PRN

## 2014-07-20 MED ORDER — OXYCODONE-ACETAMINOPHEN 5-325 MG PO TABS
1.0000 | ORAL_TABLET | ORAL | Status: DC | PRN
  Administered 2014-07-20: 2 via ORAL
  Filled 2014-07-20: qty 2

## 2014-07-20 MED ORDER — CITRIC ACID-SODIUM CITRATE 334-500 MG/5ML PO SOLN: 30.0000 mL | ORAL | Status: DC | PRN

## 2014-07-20 MED ORDER — LACTATED RINGERS IV SOLN: 500.0000 mL | Freq: Once | INTRAVENOUS | Status: DC

## 2014-07-20 MED ORDER — FENTANYL 2.5 MCG/ML BUPIVACAINE 1/10 % EPIDURAL INFUSION (WH - ANES)
14.0000 mL/h | INTRAMUSCULAR | Status: DC | PRN
  Administered 2014-07-20: 14 mL/h via EPIDURAL

## 2014-07-20 MED ORDER — IBUPROFEN 600 MG PO TABS
600.0000 mg | ORAL_TABLET | Freq: Four times a day (QID) | ORAL | Status: DC | PRN
  Administered 2014-07-20: 600 mg via ORAL
  Filled 2014-07-20: qty 1

## 2014-07-20 MED ORDER — MISOPROSTOL 200 MCG PO TABS
1000.0000 ug | ORAL_TABLET | Freq: Once | ORAL | Status: AC
  Administered 2014-07-20: 1000 ug via VAGINAL

## 2014-07-20 MED ORDER — OXYTOCIN 40 UNITS IN LACTATED RINGERS INFUSION - SIMPLE MED: 250.0000 mL/h | INTRAVENOUS | Status: DC

## 2014-07-20 NOTE — MAU Note (Signed)
PT SAYS  HAVE [facility]-  [facility]-  DOWNSTAIRS.  VE YESTERDAY  2-3 CM.  DENIES HSV AND MRSA.  GBS- UNSURE

## 2014-07-20 NOTE — Anesthesia Procedure Notes (Signed)
Epidural Patient location during procedure: OB  Preanesthetic Checklist Completed: patient identified, site marked, surgical consent, pre-op evaluation, timeout performed, IV checked, risks and benefits discussed and monitors and equipment checked  Epidural Patient position: sitting Prep: site prepped and draped and DuraPrep Patient monitoring: continuous pulse ox and blood pressure Approach: midline Injection technique: LOR air  Needle:  Needle type: Tuohy  Needle gauge: 17 G Needle length: 9 cm and 9 Needle insertion depth: 6 cm Catheter type: closed end flexible Catheter size: 19 Gauge Catheter at skin depth: 13 cm Test dose: negative  Assessment Events: blood not aspirated, injection not painful, no injection resistance, negative IV test and no paresthesia  Additional Notes Dosing of Epidural:  1st dose, through catheter .............................................  Xylocaine 40 mg  2nd dose, through catheter, after waiting 3 minutes.........Xylocaine 60 mg    ( 1% Xylo charted as a single dose in Epic Meds for ease of charting; actual dosing was fractionated as above, for saftey's sake)  As each dose occurred, patient was free of IV sx; and patient exhibited no evidence of SA injection.  Patient is more comfortable after epidural dosed. Please see RN's note for documentation of vital signs,and FHR which are stable.  Patient reminded not to try to ambulate with numb legs, and that an RN must be present when she attempts to get up.       

## 2014-07-20 NOTE — H&P (Signed)
Samantha BimlerSara Ortiz is a 23 y.o. female G1P0000 with IUP at 2079w0d presenting for labor. Pt states she has been having regular, every 5 minutes contractions, associated with none vaginal bleeding.  Membranes are intact, with active fetal movement.    [facility]  Prenatal History/Complications: none  Past Medical History: History reviewed. No pertinent past medical history.  Past Surgical History: History reviewed. No pertinent past surgical history.  Obstetrical History: OB History   Grav Para Term Preterm Abortions TAB SAB Ect Mult Living   1 0 0 0 0 0 0 0 0 0       Social History: History   Social History  . Marital Status: Married    Spouse Name: N/A    Number of Children: N/A  . Years of Education: N/A   Social History Main Topics  . Smoking status: Never Smoker   . Smokeless tobacco: Never Used  . Alcohol Use: No  . Drug Use: No  . Sexual Activity: Yes   Other Topics Concern  . None   Social History Narrative  . None    Family History: No family history on file.  Allergies: No Known Allergies  No prescriptions prior to admission    Review of Systems: Negative unless otherwise stated in History above  Physicial Blood pressure 130/72, pulse 91, temperature 97.7 F (36.5 C), temperature source Oral, resp. rate 20, height 5\' 7"  (1.702 m), weight 91.173 kg (201 lb), last menstrual period 10/06/2013. General appearance: alert, cooperative and appears stated age Lungs: clear to auscultation bilaterally Heart: regular rate and rhythm Abdomen: soft, non-tender; bowel sounds normal Extremities: Homans sign is negative, no sign of DVT Presentation: cephalic Fetal monitoringBaseline: 140 bpm, Variability: Good {> 6 bpm), Accelerations: Reactive and Decelerations: Absent Uterine activityFrequency: Every 5 minutes     Prenatal labs: ABO, Rh: A/POS/-- (12/15 1632) Antibody: NEG (12/15 1632) Rubella:   RPR: NON REAC (05/12 1412)  HBsAg: NEGATIVE (12/15  1632)  HIV: NONREACTIVE (05/12 1412)  GBS: Negative (07/10 0000)  GTT: 103  Prenatal Transfer Tool  Maternal Diabetes: No Genetic Screening: Declined Maternal Ultrasounds/Referrals: Normal Fetal Ultrasounds or other Referrals:  None Maternal Substance Abuse:  No Significant Maternal Medications:  None Significant Maternal Lab Results: Lab values include: Group B Strep negative  Results for orders placed in visit on 07/19/14 (from the past 24 hour(s))  POCT URINALYSIS DIP (DEVICE)   Collection Time    07/19/14  8:14 AM      Result Value Ref Range   Glucose, UA NEGATIVE  NEGATIVE mg/dL   Bilirubin Urine NEGATIVE  NEGATIVE   Ketones, ur NEGATIVE  NEGATIVE mg/dL   Specific Gravity, Urine 1.020  1.005 - 1.030   Hgb urine dipstick TRACE (*) NEGATIVE   pH 5.5  5.0 - 8.0   Protein, ur NEGATIVE  NEGATIVE mg/dL   Urobilinogen, UA 0.2  0.0 - 1.0 mg/dL   Nitrite NEGATIVE  NEGATIVE   Leukocytes, UA TRACE (*) NEGATIVE  POCT URINALYSIS DIP (DEVICE)   Collection Time    07/19/14 10:07 AM      Result Value Ref Range   Glucose, UA NEGATIVE  NEGATIVE mg/dL   Bilirubin Urine NEGATIVE  NEGATIVE   Ketones, ur NEGATIVE  NEGATIVE mg/dL   Specific Gravity, Urine 1.020  1.005 - 1.030   Hgb urine dipstick NEGATIVE  NEGATIVE   pH 5.5  5.0 - 8.0   Protein, ur NEGATIVE  NEGATIVE mg/dL   Urobilinogen, UA 0.2  0.0 -  1.0 mg/dL   Nitrite NEGATIVE  NEGATIVE   Leukocytes, UA SMALL (*) NEGATIVE    Assessment: Samantha Ortiz is a 23 y.o. G1P0000 at [redacted]w[redacted]d by here for Active labor  #Labor: ad mit to birthing suite #Pain: Labor support #FWB: Cat 1 #ID:  GBS negative #Feeding: Breast #MOC: undecided #Circ:  Yes  Wenda Low MD [facility] PGY-1 07/20/2014, 4:55 AM  I have seen this patient and agree with the above resident's note.  LEFTWICH-KIRBY, Patricie Geeslin Certified Nurse-Midwife

## 2014-07-20 NOTE — Anesthesia Preprocedure Evaluation (Signed)

## 2014-07-21 LAB — CBC
HEMATOCRIT: 27.5 % — AB (ref 36.0–46.0)
Hemoglobin: 8.8 g/dL — ABNORMAL LOW (ref 12.0–15.0)
MCH: 26.2 pg (ref 26.0–34.0)
MCHC: 32 g/dL (ref 30.0–36.0)
MCV: 81.8 fL (ref 78.0–100.0)
Platelets: 161 10*3/uL (ref 150–400)
RBC: 3.36 MIL/uL — ABNORMAL LOW (ref 3.87–5.11)
RDW: 15.3 % (ref 11.5–15.5)
WBC: 20.7 10*3/uL — ABNORMAL HIGH (ref 4.0–10.5)

## 2014-07-21 LAB — CCBB MATERNAL DONOR DRAW

## 2014-07-21 NOTE — Progress Notes (Signed)
Patient having trouble urinating on her own.  At 0305, very small trickle.  Bladder scan at 0545 showed 481 ml of fluid.  Got up to bathroom, ran water, blew bubbles, warm water on lower abdomen, hand in water, and cranberry drink.  Patient was able to void small amounts 2 times;; beginning to feel urge to pee.

## 2014-07-21 NOTE — Progress Notes (Signed)
Post Partum Day 1 Subjective:  Samantha Ortiz is a 23 y.o. G1P1001 s/p SVD.  No acute events overnight.  Pt denies problems with ambulating, voiding or po intake.  She  denies nausea or vomiting.  Pain is well controlled.  She has had flatus. She has not had bowel movement.  Lochia Small.  Plan for birth control is abstinence.  Method of Feeding: breast  Objective: Blood pressure 124/57, pulse 89, temperature 97.7 F (36.5 C), temperature source Oral, resp. rate 16, height 5\' 7"  (1.702 m), weight 201 lb (91.173 kg), last menstrual period 10/06/2013, SpO2 100.00%, unknown if currently breastfeeding.  Physical Exam:  General: alert, cooperative and no distress Lochia:normal flow Chest: CTAB Heart: RRR no m/r/g Abdomen: +BS, soft, nontender,  Uterine Fundus: firm DVT Evaluation: No evidence of DVT seen on physical exam. Extremities: no edema   Recent Labs  07/20/14 2047 07/21/14 0615  HGB 10.1* 8.8*  HCT 30.7* 27.5*    Assessment/Plan:  ASSESSMENT: Samantha Ortiz is a 23 y.o. G1P1001 9222w0d s/p SVD - continue routine postpartum care - postpartum moderate anemia: asymptomatic, 11.7==>8.8   LOS: 1 day   Vernia Teem ROCIO 07/21/2014, 9:42 AM

## 2014-07-21 NOTE — Anesthesia Postprocedure Evaluation (Signed)
  Anesthesia Post-op Note  Patient: Samantha Ortiz  Procedure(s) Performed: * No procedures listed *  Patient Location: Mother/Baby  Anesthesia Type:Epidural  Level of Consciousness: awake  Airway and Oxygen Therapy: Patient Spontanous Breathing  Post-op Pain: none  Post-op Assessment: Post-op Vital signs reviewed, Patient's Cardiovascular Status Stable, Respiratory Function Stable, Patent Airway, No signs of Nausea or vomiting, Adequate PO intake, Pain level controlled, No headache, No backache, No residual numbness and No residual motor weakness  Post-op Vital Signs: Reviewed and stable  Last Vitals:  Filed Vitals:   07/21/14 0614  BP: 124/57  Pulse: 89  Temp: 36.5 C  Resp: 16    Complications: No apparent anesthesia complications

## 2014-07-21 NOTE — Progress Notes (Signed)
Patient reports urinating "alot."

## 2014-07-21 NOTE — Progress Notes (Signed)
UR chart review completed.  

## 2014-07-22 MED ORDER — IBUPROFEN 600 MG PO TABS: 600.0000 mg | ORAL_TABLET | Freq: Four times a day (QID) | ORAL | Status: DC

## 2014-07-22 NOTE — Discharge Instructions (Signed)

## 2014-07-22 NOTE — Discharge Summary (Signed)
Obstetric Discharge Summary Reason for Admission: onset of labor Prenatal Procedures: ultrasound Intrapartum Procedures: vacuum Postpartum Procedures: none Complications-Operative and Postpartum: 2nd degree perineal laceration Hemoglobin  Date Value Ref Range Status  07/21/2014 8.8* 12.0 - 15.0 g/dL Final     HCT  Date Value Ref Range Status  07/21/2014 27.5* 36.0 - 46.0 % Final    Discharge Diagnoses: Term Pregnancy-delivered  Hospital Course:  Samantha Ortiz is a 23 y.o. G1P1001 who presented with labor.  She had a vacuum assisted vaginal delivery . She was able to ambulate, tolerate PO and void normally. She was discharged home with instructions for postpartum care.    Delivery Note At 7:23 PM a viable female was delivered via Vaginal, Vacuum (Extractor) (Presentation: Direct OA ). APGAR: 7, 9; weight not documented Placenta status: Intact, Spontaneous. Cord: 3 vessels with the following complications: None.   Anesthesia: Epidural  Episiotomy: None  Lacerations: Perineal;2nd degree  Suture Repair: 3.0 vicryl  Est. Blood Loss (mL): 650  Mom to postpartum. Baby to Nursery.  William DaltonMcEachern, Morgan  07/20/2014, 8:41 PM   Physical Exam:  General: alert, cooperative and no distress Lochia: appropriate Uterine Fundus: firm DVT Evaluation: No evidence of DVT seen on physical exam. Negative Homan's sign. No cords or calf tenderness.  Discharge Information: Date: 07/22/2014 Activity: pelvic rest Diet: routine Medications: PNV, Ibuprofen, Colace and Iron Baby feeding: plans to breastfeed Contraception: Calendar/day counting method Condition: stable and improved Instructions: refer to practice specific booklet Discharge to: home   Newborn Data: Live born female  Birth Weight: 8 lb 9.2 oz (3890 g) APGAR: 7, 9  Home with mother.  Merlinda Frederickrystal Dorse, MD [facility] PGY-1 07/22/2014, 7:48 AM  I have seen and examined this patient and I agree with the above. Cam HaiSHAW, Bryker Fletchall CNM 7:56  AM 07/22/2014

## 2014-07-23 ENCOUNTER — Inpatient Hospital Stay (HOSPITAL_COMMUNITY): Admission: RE | Admit: 2014-07-23 | Payer: MEDICAID | Source: Ambulatory Visit

## 2014-07-23 ENCOUNTER — Ambulatory Visit: Payer: Self-pay

## 2014-07-23 NOTE — Lactation Note (Signed)
This note was copied from the chart of Samantha Ortiz Humber. Lactation Consultation Note  Patient Name: Samantha Ortiz Forgue ZOXWR'UToday's Date: 07/23/2014 Reason for consult: Follow-up assessment;Hyperbilirubinemia Baby is 5772 hours old and exclusively breastfeeding with LATCH scores=10 but remaining in hospital for phototx.  Mom is nursing baby for 10-20 minute feedings and output is meeting guidelines for this day of life.  Mom is applying her own nipple cream to nipples between feedings and she states that label says "safe for baby" but LC recommends ebm and air to nipples as best source of healing.  LC encouraged continued ad lib cue feedings.  LC also discussed the transition of baby's stools to brown as a sign that baby is clearing the bilirubin and jaundice out of his system.   Maternal Data    Feeding    LATCH Score/Interventions               Most recent LATCH score=10 and dyad consistently has 9 or 10 LATCH scores       Lactation Tools Discussed/Used   Cue feedings Output as sign of sufficient milk intake  Consult Status Consult Status: Follow-up Date: 07/24/14 Follow-up type: In-patient    Warrick ParisianBryant, Ilian Wessell [facility] 07/23/2014, 7:25 PM

## 2014-07-23 NOTE — Discharge Summary (Signed)
Attestation of Attending Supervision of Advanced Practitioner: Evaluation and management procedures were performed by the PA/NP/CNM/OB Fellow under my supervision/collaboration. Chart reviewed and agree with management and plan.  Lailani Tool V 07/23/2014 7:37 AM

## 2014-08-27 ENCOUNTER — Encounter (HOSPITAL_COMMUNITY): Payer: Self-pay | Admitting: *Deleted

## 2014-08-27 ENCOUNTER — Inpatient Hospital Stay (HOSPITAL_COMMUNITY)
Admission: AD | Admit: 2014-08-27 | Discharge: 2014-08-27 | Disposition: A | Discharge status: Home / Self Care | Payer: Medicaid Other | Source: Ambulatory Visit | Attending: Obstetrics & Gynecology | Admitting: Obstetrics & Gynecology

## 2014-08-27 DIAGNOSIS — L02818 Cutaneous abscess of other sites: Secondary | ICD-10-CM

## 2014-08-27 DIAGNOSIS — L03818 Cellulitis of other sites: Secondary | ICD-10-CM

## 2014-08-27 DIAGNOSIS — N644 Mastodynia: Secondary | ICD-10-CM | POA: Diagnosis present

## 2014-08-27 DIAGNOSIS — O9112 Abscess of breast associated with the puerperium: Secondary | ICD-10-CM | POA: Diagnosis not present

## 2014-08-27 MED ORDER — SULFAMETHOXAZOLE-TMP DS 800-160 MG PO TABS: 1.0000 | ORAL_TABLET | Freq: Two times a day (BID) | ORAL | Status: DC

## 2014-08-27 NOTE — Discharge Instructions (Signed)

## 2014-08-27 NOTE — MAU Note (Signed)
Delivered- vag 8/18. Breast tenderness first noted yesterday.  Left breast at 3 o'clock position.  Small white/raised nodule, pie shaped red area.  Pt is breast feeding. Denies fever.

## 2014-08-27 NOTE — Progress Notes (Signed)
Called to assess mom in MAU. She is here comlpaining of pain and redness. Small Montgomery gland swollen and area of redness noted at 3:00 on the left breast. Reports this started about 1 week ago and has gotten worse the last few days.Mom reports that baby has been nursing well but she was afraid to let the baby nurse on that breast in case the milk was bad. Both breasts very full.Baby had formula once today. Assisted mom with DEBP and she pumped about 10 minutes and obtained about 60 cc's. Baby fussing and she latched baby and he nursed well on right breast for about 5 min. Encouraged mom to latch baby to left breast. She reports pain for the first few minutes then eases off. Baby nursed for about 10 minutes and then off the breast satisfied. Encouraged to continue nursing on that breast- reassured that milk is fine for baby to drink. Mom concerned about green stool. Reassurance given  Discussed that antibiotics are OK to take and continue nursing baby. No further questions at present. To call prn.

## 2014-08-27 NOTE — MAU Provider Note (Signed)
Attestation of Attending Supervision of Advanced Practitioner (PA/CNM/NP): Evaluation and management procedures were performed by the Advanced Practitioner under my supervision and collaboration.  I have reviewed the Advanced Practitioner's note and chart, and I agree with the management and plan.  Lennyx Verdell, MD, FACOG Attending Obstetrician & Gynecologist Faculty Practice, [facility]   

## 2014-08-27 NOTE — MAU Provider Note (Signed)
  History     CSN: [id number]  Arrival date and time: 08/27/14 1410   First Provider Initiated Contact with Patient 08/27/14 1425      Chief Complaint  Patient presents with  . Breast Pain   HPI  Samantha Ortiz is a 23 y.o. G1P1001 who is about 3 weeks S/P NSVD. She has a small "bump" on her nipple that has gotten bigger over the last few days. In addition as it has gotten bigger she has also developed a red area on the breast that is very tender. She denies any fever or body aches.   Past Medical History  Diagnosis Date  . Medical history non-contributory     Past Surgical History  Procedure Laterality Date  . No past surgeries      No family history on file.  History  Substance Use Topics  . Smoking status: Never Smoker   . Smokeless tobacco: Never Used  . Alcohol Use: No    Allergies: No Known Allergies  Prescriptions prior to admission  Medication Sig Dispense Refill  . ibuprofen (ADVIL,MOTRIN) 600 MG tablet Take 1 tablet (600 mg total) by mouth every 6 (six) hours.  30 tablet  0    ROS Physical Exam   Blood pressure 131/68, pulse 88, temperature 98.4 F (36.9 C), temperature source Oral, resp. rate 18, currently breastfeeding.  Physical Exam  Nursing note and vitals reviewed. Constitutional: She is oriented to person, place, and time. She appears well-developed and well-nourished. No distress.  Cardiovascular: Normal rate.   Respiratory: Right breast exhibits no inverted nipple, no mass, no nipple discharge, no skin change and no tenderness. Left breast exhibits mass (enlarged and infected appearing mongomergy gland. ), skin change and tenderness.    Neurological: She is alert and oriented to person, place, and time.  Skin: Skin is warm and dry.  Psychiatric: She has a normal mood and affect.    MAU Course  Procedures  1425: Lactation here to see the patient, and assist with pumping.  1509: Breast is softer after pumping D/W Dr. Macon Large, will treat  with Bactrim   Assessment and Plan   1. Cellulitis of other specified site      Medication List         sulfamethoxazole-trimethoprim 800-160 MG per tablet  Commonly known as:  BACTRIM DS  Take 1 tablet by mouth 2 (two) times daily.       Follow-up Information   Follow up with [facility] [place] [facility] In 5 days.   Specialty:  Lactation   Contact information:   [address] [id number] [place] [address] [phone]       Tawnya Crook 08/27/2014, 2:32 PM

## 2014-09-01 ENCOUNTER — Encounter: Payer: Self-pay | Admitting: Obstetrics & Gynecology

## 2014-09-01 ENCOUNTER — Ambulatory Visit (INDEPENDENT_AMBULATORY_CARE_PROVIDER_SITE_OTHER): Payer: Medicaid Other | Admitting: Obstetrics & Gynecology

## 2014-09-01 MED ORDER — NORETHINDRONE 0.35 MG PO TABS: 1.0000 | ORAL_TABLET | Freq: Every day | ORAL | Status: DC

## 2014-09-01 NOTE — Progress Notes (Signed)
   Subjective:    Patient ID: Samantha BimlerSara Ortiz, female    DOB: 11/21/1991, 23 y.o.   MRN: [id number]  HPI  This 23 yo P1 is here for her pp visit. She reports normal bowel and bladder function. She has not had sex yet and wants the POPs until she can get to the health dept to get the Nexplanon (she is self-pay). She is breastfeeding and her son is growing well. She is currently taking antibiotics for a left periareolar breast infection.  Review of Systems     Objective:   Physical Exam Erythematous area of left breast noted (about 3 cm in diameter)  Perineum with marked atrophy and a not completely healed area at the right vaginal introitus Bimanual exam normal       Assessment & Plan:  Contraception- POPs for now, Nexplanon in the future at the health dept

## 2014-09-23 ENCOUNTER — Ambulatory Visit (INDEPENDENT_AMBULATORY_CARE_PROVIDER_SITE_OTHER): Payer: Medicaid Other | Admitting: Obstetrics & Gynecology

## 2014-09-23 ENCOUNTER — Other Ambulatory Visit: Payer: Self-pay | Admitting: Obstetrics & Gynecology

## 2014-09-23 ENCOUNTER — Encounter: Payer: Self-pay | Admitting: Obstetrics & Gynecology

## 2014-09-23 LAB — TSH: TSH: 2.733 u[IU]/mL (ref 0.350–4.500)

## 2014-09-23 NOTE — Progress Notes (Signed)
   CLINIC ENCOUNTER NOTE  History:  23 y.o. G1P1001 here today for evaluation of persistent bleeding since vaginal delivery on 07/20/14.  She had a VAVD followed by intact placental delivery. Had a 2nd degree perineal laceration.  Breastfeeding, was started on POPs for contraception but she has not been taking these OCPs.  Not sexually active.  Also described foul vaginal odor    The following portions of the patient's history were reviewed and updated as appropriate: allergies, current medications, past family history, past medical history, past social history, past surgical history and problem list. Normal pap and negative GC and Chlam on 12/09/2013.    Review of Systems:  Pertinent items are noted in HPI.  Objective:  Physical Exam BP 117/61  Pulse 64  Temp(Src) 98 F (36.7 C)  Ht 5' 7.5" (1.715 m)  Wt 184 lb 4.8 oz (83.598 kg)  BMI 28.42 kg/m2  Breastfeeding? Yes Gen: NAD Abd: Soft, nontender and nondistended Pelvic: Normal appearing external genitalia with well-healing perineal laceration.  On palpation of laceration area, there was a spot of blood noted. Normal appearing vaginal mucosa and cervix.  Small amount of blood noted in vault, no active bleeding.  Bloody discharge, cultures obtained.  Small uterus, no other palpable masses, no uterine or adnexal tenderness.   Assessment & Plan:  Patient assured that bleeding can continue after SVD for up to 12 weeks after delivery but given her concern, will check BHCG and pelvic ultrasound to rule out possibility of retained products of conception.   TSH also checked. Pelvic cultures sent to rule out infection; will follow up results and manage accordingly. Routine preventative health maintenance measures emphasized.   Jaynie CollinsUGONNA  Flornce Record, MD, FACOG Attending Obstetrician & Gynecologist Center for [facility], [facility]

## 2014-09-23 NOTE — Patient Instructions (Signed)
Dysfunctional Uterine Bleeding Normally, menstrual periods begin between ages 11 to 17 in young women. A normal menstrual cycle/period may begin every 23 days up to 35 days and lasts from 1 to 7 days. Around 12 to 14 days before your menstrual period starts, ovulation (ovary produces an egg) occurs. When counting the time between menstrual periods, count from the first day of bleeding of the previous period to the first day of bleeding of the next period. Dysfunctional (abnormal) uterine bleeding is bleeding that is different from a normal menstrual period. Your periods may come earlier or later than usual. They may be lighter, have blood clots or be heavier. You may have bleeding between periods, or you may skip one period or more. You may have bleeding after sexual intercourse, bleeding after menopause, or no menstrual period. CAUSES   Pregnancy (normal, miscarriage, tubal).  IUDs (intrauterine device, birth control).  Birth control pills.  Hormone treatment.  Menopause.  Infection of the cervix.  Blood clotting problems.  Infection of the inside lining of the uterus.  Endometriosis, inside lining of the uterus growing in the pelvis and other female organs.  Adhesions (scar tissue) inside the uterus.  Obesity or severe weight loss.  Uterine polyps inside the uterus.  Cancer of the vagina, cervix, or uterus.  Ovarian cysts or polycystic ovary syndrome.  Medical problems (diabetes, thyroid disease).  Uterine fibroids (noncancerous tumor).  Problems with your female hormones.  Endometrial hyperplasia, very thick lining and enlarged cells inside of the uterus.  Medicines that interfere with ovulation.  Radiation to the pelvis or abdomen.  Chemotherapy. DIAGNOSIS   Your doctor will discuss the history of your menstrual periods, medicines you are taking, changes in your weight, stress in your life, and any medical problems you may have.  Your doctor will do a physical  and pelvic examination.  Your doctor may want to perform certain tests to make a diagnosis, such as:  Pap test.  Blood tests.  Cultures for infection.  CT scan.  Ultrasound.  Hysteroscopy.  Laparoscopy.  MRI.  Hysterosalpingography.  D and C.  Endometrial biopsy. TREATMENT  Treatment will depend on the cause of the dysfunctional uterine bleeding (DUB). Treatment may include:  Observing your menstrual periods for a couple of months.  Prescribing medicines for medical problems, including:  Antibiotics.  Hormones.  Birth control pills.  Removing an IUD (intrauterine device, birth control).  Surgery:  D and C (scrape and remove tissue from inside the uterus).  Laparoscopy (examine inside the abdomen with a lighted tube).  Uterine ablation (destroy lining of the uterus with electrical current, laser, heat, or freezing).  Hysteroscopy (examine cervix and uterus with a lighted tube).  Hysterectomy (remove the uterus). HOME CARE INSTRUCTIONS   If medicines were prescribed, take exactly as directed. Do not change or switch medicines without consulting your caregiver.  Long term heavy bleeding may result in iron deficiency. Your caregiver may have prescribed iron pills. They help replace the iron that your body lost from heavy bleeding. Take exactly as directed.  Do not take aspirin or medicines that contain aspirin one week before or during your menstrual period. Aspirin may make the bleeding worse.  If you need to change your sanitary pad or tampon more than once every 2 hours, stay in bed with your feet elevated and a cold pack on your lower abdomen. Rest as much as possible, until the bleeding stops or slows down.  Eat well-balanced meals. Eat foods high in iron. Examples   are:  Leafy green vegetables.  Whole-grain breads and cereals.  Eggs.  Meat.  Liver.  Do not try to lose weight until the abnormal bleeding has stopped and your blood iron level is  back to normal. Do not lift more than ten pounds or do strenuous activities when you are bleeding.  For a couple of months, make note on your calendar, marking the start and ending of your period, and the type of bleeding (light, medium, heavy, spotting, clots or missed periods). This is for your caregiver to better evaluate your problem. SEEK MEDICAL CARE IF:   You develop nausea (feeling sick to your stomach) and vomiting, dizziness, or diarrhea while you are taking your medicine.  You are getting lightheaded or weak.  You have any problems that may be related to the medicine you are taking.  You develop pain with your DUB.  You want to remove your IUD.  You want to stop or change your birth control pills or hormones.  You have any type of abnormal bleeding mentioned above.  You are over 16 years old and have not had a menstrual period yet.  You are 23 years old and you are still having menstrual periods.  You have any of the symptoms mentioned above.  You develop a rash. SEEK IMMEDIATE MEDICAL CARE IF:   An oral temperature above 102 F (38.9 C) develops.  You develop chills.  You are changing your sanitary pad or tampon more than once an hour.  You develop abdominal pain.  You pass out or faint. Document Released: 11/16/2000 Document Revised: 02/11/2012 Document Reviewed: 10/18/2009 ExitCare Patient Information 2015 [employer]. This information is not intended to replace advice given to you by your health care provider. Make sure you discuss any questions you have with your health care provider.  

## 2014-09-23 NOTE — Progress Notes (Signed)
Here because of continued vaginal bleeding off and on since she had her baby 07/20/14. States also notices bad odor.

## 2014-09-24 ENCOUNTER — Telehealth: Payer: Self-pay

## 2014-09-24 LAB — WET PREP, GENITAL
Clue Cells Wet Prep HPF POC: NONE SEEN
Trich, Wet Prep: NONE SEEN
WBC WET PREP: NONE SEEN
Yeast Wet Prep HPF POC: NONE SEEN

## 2014-09-24 LAB — GC/CHLAMYDIA PROBE AMP
CT Probe RNA: NEGATIVE
GC Probe RNA: NEGATIVE

## 2014-09-24 LAB — HCG, QUANTITATIVE, PREGNANCY

## 2014-09-24 NOTE — Telephone Encounter (Signed)
Message copied by Faythe CasaBELLAMY, JEANETTA M on Fri Sep 24, 2014  9:37 AM ------      Message from: Jaynie CollinsANYANWU, UGONNA A      Created: Fri Sep 24, 2014  8:16 AM       Negative labs.  Please call to inform patient of results and recommendations.       ------

## 2014-09-24 NOTE — Telephone Encounter (Signed)
Called pt and informed her of her <2 results on her beta.  I asked pt if she is still having bleeding.  Pt stated "yes".  I informed her of her appt for an US on 09/27/14 I advised pt to continue her appt with the US for f/u.  Pt stated understanding with no further questions.

## 2014-09-27 ENCOUNTER — Ambulatory Visit (HOSPITAL_COMMUNITY)
Admission: RE | Admit: 2014-09-27 | Discharge: 2014-09-27 | Disposition: A | Discharge status: Home / Self Care | Payer: Medicaid Other | Source: Ambulatory Visit | Attending: Obstetrics & Gynecology | Admitting: Obstetrics & Gynecology

## 2014-09-27 DIAGNOSIS — N938 Other specified abnormal uterine and vaginal bleeding: Secondary | ICD-10-CM | POA: Insufficient documentation

## 2014-09-28 ENCOUNTER — Telehealth: Payer: Self-pay

## 2014-09-28 ENCOUNTER — Telehealth: Payer: Self-pay | Admitting: *Deleted

## 2014-09-28 NOTE — Telephone Encounter (Signed)
Message copied by Gerome ApleyZEYFANG, Randie Tallarico L on Tue Sep 28, 2014 11:54 AM ------      Message from: Jaynie CollinsANYANWU, UGONNA A      Created: Tue Sep 28, 2014 11:31 AM       Unremarkable ultrasound, no intervention needed.  Please call to inform patient of results.       ------

## 2014-09-28 NOTE — Telephone Encounter (Signed)
Called Malon's home number- spoke to female who said he was her husband and asked me to call her mobile number.  Called Jazzie's mobile number and was unable to leave message- heard message customer unavailable .

## 2014-09-28 NOTE — Telephone Encounter (Addendum)
Received a call from Erskine SquibbJane from [facility] [phone])-- reporting results from U/S completed last night. Will discuss with Dr. Macon LargeAnyanwu (ordering physician) in afternoon clinic and develop plan of care.   1253: Dr Macon LargeAnyanwu reviewed result-- unremarkable. Patient to be called.

## 2014-09-29 NOTE — Telephone Encounter (Signed)
Called Samantha Ortiz and informed her of US results as per note from Dr. Macon LargeAnyanwu.  Samantha Ortiz voiced understanding.

## 2014-10-04 ENCOUNTER — Encounter: Payer: Self-pay | Admitting: Obstetrics & Gynecology

## 2014-10-20 ENCOUNTER — Encounter (HOSPITAL_COMMUNITY): Payer: Self-pay

## 2014-10-20 ENCOUNTER — Emergency Department (HOSPITAL_COMMUNITY)
Admission: EM | Admit: 2014-10-20 | Discharge: 2014-10-20 | Disposition: A | Discharge status: Home / Self Care | Payer: Medicaid Other | Attending: Emergency Medicine | Admitting: Emergency Medicine

## 2014-10-20 ENCOUNTER — Emergency Department (HOSPITAL_COMMUNITY): Payer: Medicaid Other

## 2014-10-20 DIAGNOSIS — R1032 Left lower quadrant pain: Secondary | ICD-10-CM

## 2014-10-20 DIAGNOSIS — M25552 Pain in left hip: Secondary | ICD-10-CM | POA: Diagnosis not present

## 2014-10-20 LAB — CBC WITH DIFFERENTIAL/PLATELET
BASOS ABS: 0 10*3/uL (ref 0.0–0.1)
Basophils Relative: 0 % (ref 0–1)
EOS PCT: 1 % (ref 0–5)
Eosinophils Absolute: 0.1 10*3/uL (ref 0.0–0.7)
HEMATOCRIT: 39.1 % (ref 36.0–46.0)
Hemoglobin: 12.6 g/dL (ref 12.0–15.0)
LYMPHS PCT: 19 % (ref 12–46)
Lymphs Abs: 1.9 10*3/uL (ref 0.7–4.0)
MCH: 25.8 pg — ABNORMAL LOW (ref 26.0–34.0)
MCHC: 32.2 g/dL (ref 30.0–36.0)
MCV: 80 fL (ref 78.0–100.0)
MONO ABS: 1.1 10*3/uL — AB (ref 0.1–1.0)
Monocytes Relative: 11 % (ref 3–12)
Neutro Abs: 7 10*3/uL (ref 1.7–7.7)
Neutrophils Relative %: 69 % (ref 43–77)
Platelets: 205 10*3/uL (ref 150–400)
RBC: 4.89 MIL/uL (ref 3.87–5.11)
RDW: 14.4 % (ref 11.5–15.5)
WBC: 10.1 10*3/uL (ref 4.0–10.5)

## 2014-10-20 LAB — SEDIMENTATION RATE: Sed Rate: 11 mm/hr (ref 0–22)

## 2014-10-20 MED ORDER — KETOROLAC TROMETHAMINE 30 MG/ML IJ SOLN
30.0000 mg | Freq: Once | INTRAMUSCULAR | Status: AC
  Administered 2014-10-20: 30 mg via INTRAVENOUS
  Filled 2014-10-20: qty 1

## 2014-10-20 MED ORDER — OXYCODONE-ACETAMINOPHEN 5-325 MG PO TABS: 1.0000 | ORAL_TABLET | ORAL | Status: DC | PRN

## 2014-10-20 MED ORDER — ONDANSETRON 8 MG PO TBDP: 8.0000 mg | ORAL_TABLET | Freq: Three times a day (TID) | ORAL | Status: DC | PRN

## 2014-10-20 MED ORDER — OXYCODONE-ACETAMINOPHEN 5-325 MG PO TABS
1.0000 | ORAL_TABLET | Freq: Once | ORAL | Status: AC
  Administered 2014-10-20: 1 via ORAL
  Filled 2014-10-20: qty 1

## 2014-10-20 MED ORDER — IBUPROFEN 600 MG PO TABS: 600.0000 mg | ORAL_TABLET | Freq: Three times a day (TID) | ORAL | Status: DC | PRN

## 2014-10-20 NOTE — ED Notes (Signed)
Pt complains of left hip pain since yesterday, no injury noted, she states that it feels achy, she had a hard time bearing weight and transferring into the bed.

## 2014-10-20 NOTE — ED Provider Notes (Signed)
CSN: [id number]     Arrival date & time 10/20/14  [id number] History   First MD Initiated Contact with Patient 10/20/14 0541     Chief Complaint  Patient presents with  . Hip Pain      HPI Patient reports worsening left groin pain since yesterday.  Has been present for several days but seems to be worsening.  It is focused in her medial left groin.  She has no urinary complaints.  She denies abdominal pain.  She denies back pain or flank pain.  No injury or trauma.  She reports pain is worse with range of motion of her left hip and palpation of her left proximal medial thigh.  She also reports pain is worse with ambulation.  No fevers or chills.  No numbness or tingling of her left lower extremity.  No weakness of her left lower extremity.  No skin changes noted by patient or husband.   Past Medical History  Diagnosis Date  . Medical history non-contributory    Past Surgical History  Procedure Laterality Date  . No past surgeries     History reviewed. No pertinent family history. History  Substance Use Topics  . Smoking status: Never Smoker   . Smokeless tobacco: Never Used  . Alcohol Use: No   OB History    Gravida Para Term Preterm AB TAB SAB Ectopic Multiple Living   1 1 1  0 0 0 0 0 0 1     Review of Systems  All other systems reviewed and are negative.     Allergies  Review of patient's allergies indicates no known allergies.  Home Medications   Prior to Admission medications   Medication Sig Start Date End Date Taking? Authorizing Provider  acetaminophen (TYLENOL) 500 MG tablet Take 1,000 mg by mouth every 6 (six) hours as needed (for pain.).   Yes Historical Provider, MD  ibuprofen (ADVIL,MOTRIN) 600 MG tablet Take 1 tablet (600 mg total) by mouth every 8 (eight) hours as needed. 10/20/14   Lyanne CoKevin M Ilias Stcharles, MD  oxyCODONE-acetaminophen (PERCOCET/ROXICET) 5-325 MG per tablet Take 1 tablet by mouth every 4 (four) hours as needed for severe pain. 10/20/14   Lyanne CoKevin M Mansi Tokar,  MD   BP 116/65 mmHg  Pulse 67  Temp(Src) 97.6 F (36.4 C) (Oral)  Resp 20  Ht 5\' 10"  (1.778 m)  Wt 178 lb 9.2 oz (81 kg)  BMI 25.62 kg/m2  SpO2 100% Physical Exam  Constitutional: She is oriented to person, place, and time. She appears well-developed and well-nourished. No distress.  HENT:  Head: Normocephalic and atraumatic.  Eyes: EOM are normal.  Neck: Normal range of motion.  Cardiovascular: Normal rate and regular rhythm.   Pulmonary/Chest: Effort normal and breath sounds normal.  Abdominal: Soft. She exhibits no distension. There is no tenderness. There is no rebound and no guarding.  Musculoskeletal:  Left lower extremity without skin changes.  Normal PT and DP pulse in left foot.  Normal strength in left lower extremity major muscle groups.  Normal range of motion of left ankle left knee.  Patient able to range the left hip to approximately 80 at which point she begins having some discomfort and pain.  She reports some pain with internal and external rotation of her left hip.  External rotation gives her slightly more discomfort than internal rotation.  No warmth or swelling of the left hip.  No tenderness of her left sciatic groove.  No lumbar tenderness.  Neurological: She is alert  and oriented to person, place, and time.  Skin: Skin is warm and dry.  Psychiatric: She has a normal mood and affect. Judgment normal.  Nursing note and vitals reviewed.   ED Course  Procedures (including critical care time) Labs Review Labs Reviewed  CBC WITH DIFFERENTIAL - Abnormal; Notable for the following:    MCH 25.8 (*)    Monocytes Absolute 1.1 (*)    All other components within normal limits  SEDIMENTATION RATE    Imaging Review  Dg Hip Complete Left  10/20/2014   CLINICAL DATA:  New left hip pain and left groin pain. No known trauma.  EXAM: LEFT HIP - COMPLETE 2+ VIEW  COMPARISON:  None.  FINDINGS: Pelvis appears intact. No displaced fractures identified. SI joints and  symphysis pubis are not displaced. Left hip appears intact. No evidence of acute fracture or dislocation. No focal bone lesion or bone destruction. Soft tissues are unremarkable.  IMPRESSION: Negative.   Electronically Signed   By: Burman NievesWilliam  Stevens M.D.   On: 10/20/2014 06:43  I personally reviewed the imaging tests through PACS system I reviewed available ER/hospitalization records through the EMR    EKG Interpretation None      MDM   Final diagnoses:  Left groin pain  Left hip pain    Pain improving emergency department.  Discharge home in good condition.  I suspect this is more of a muscular issue such as a groin strain.  I do not believe she has a septic arthritis.  Her plain films are negative for acute fracture or abnormalities of the pelvis or left hip.  Doubt DVT.  No signs of cellulitis.  Doubt myositis.patient understands to return to the ER for new or worsening symptoms.    Lyanne CoKevin M Ellyn Rubiano, MD 10/20/14 [id number]

## 2014-10-20 NOTE — Discharge Instructions (Signed)
Groin Strain °A groin strain (also called a groin pull) is an injury to the muscles or tendon on the upper inner part of the thigh. These muscles are called the adductor muscles or groin muscles. They are responsible for moving the leg across the body. A muscle strain occurs when a muscle is overstretched and some muscle fibers are torn. A groin strain can range from mild to severe depending on how many muscle fibers are affected and whether the muscle fibers are partially or completely torn.  °Groin strains usually occur during exercise or participation in sports. The injury often happens when a sudden, violent force is placed on a muscle, stretching the muscle too far. A strain is more likely to occur when your muscles are not warmed up or if you are not properly conditioned. Depending on the severity of the groin strain, recovery time may vary from a few weeks to several weeks. Severe injuries often require 4-6 weeks for recovery. In these cases, complete healing can take 4-5 months.  °CAUSES  °· Stretching the groin muscles too far or too suddenly, often during side-to-side motion with an abrupt change in direction. °· Putting repeated stress on the groin muscles over a long period of time. °· Performing vigorous activity without properly stretching the groin muscles beforehand. °SYMPTOMS  °· Pain and tenderness in the groin area. This begins as sharp pain and persists as a dull ache. °· Popping or snapping feeling when the injury occurs (for severe strains). °· Swelling or bruising. °· Muscle spasms. °· Weakness in the leg. °· Stiffness in the groin area with decreased ability to move the affected muscles. °DIAGNOSIS  °Your caregiver will perform a physical exam to diagnose a groin strain. You will be asked about your symptoms and how the injury occurred. X-rays are sometimes needed to rule out a broken bone or cartilage problems. Your caregiver may order a CT scan or MRI if a complete muscle tear is  suspected. °TREATMENT  °A groin strain will often heal on its own. Your caregiver may prescribe medicines to help manage pain and swelling (anti-inflammatory medicine). You may be told to use crutches for the first few days to minimize your pain. °HOME CARE INSTRUCTIONS  °· Rest. Do not use the strained muscle if it causes pain. °· Put ice on the injured area. °¨ Put ice in a plastic bag. °¨ Place a towel between your skin and the bag. °¨ Leave the ice on for 15-20 minutes, every 2-3 hours. Do this for the first 2 days after the injury.  °· Only take over-the-counter or prescription medicines as directed by your caregiver. °· Wrap the injured area with an elastic bandage as directed by your caregiver. °· Keep the injured leg raised (elevated). °· Walk, stretch, and perform range-of-motion exercises to improve blood flow to the injured area. Only perform these activities if you can do so without any pain. °To prevent muscle strains: °· Warm up before exercise. °· Develop proper conditioning and strength in the groin muscles. °SEEK IMMEDIATE MEDICAL CARE IF:  °· You have increased pain or swelling in the affected area.   °· Your symptoms are not improving or are getting worse. °MAKE SURE YOU:  °· Understand these instructions. °· Will watch your condition. °· Will get help right away if you are not doing well or get worse. °Document Released: 07/17/2004 Document Revised: 11/05/2012 Document Reviewed: 07/23/2012 °[employer]® Patient Information ©2015 [employer]. This information is not intended to replace advice given to you   by your health care provider. Make sure you discuss any questions you have with your health care provider. ° °

## 2014-10-27 ENCOUNTER — Encounter: Payer: Self-pay | Admitting: Obstetrics & Gynecology

## 2015-03-05 IMAGING — US US OB COMP +14 WK
2 series · 12 of 28 positions shown · non-contrast
Comparison: none

[Series 1: us ob comp +14 wk · 2 of 14 slices shown (1 of 2)]
[im 4/14]
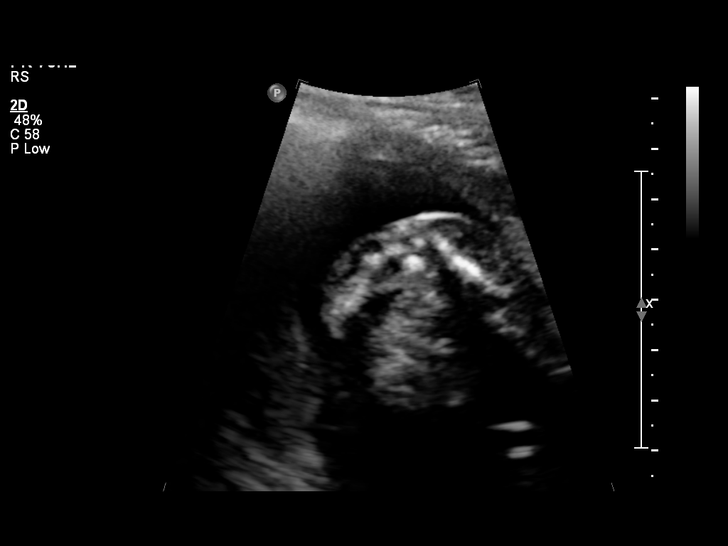
[im 10/14]
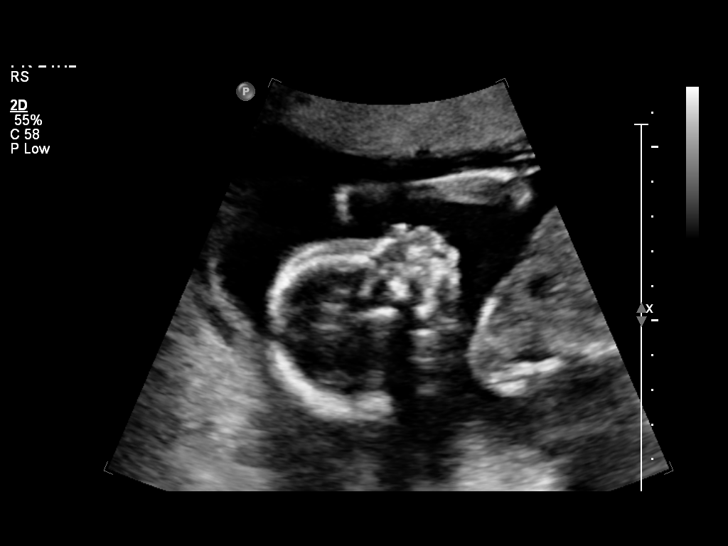

[Series 1: us ob comp +14 wk · 10 of 64 slices shown (2 of 2)]
[im 1/64]
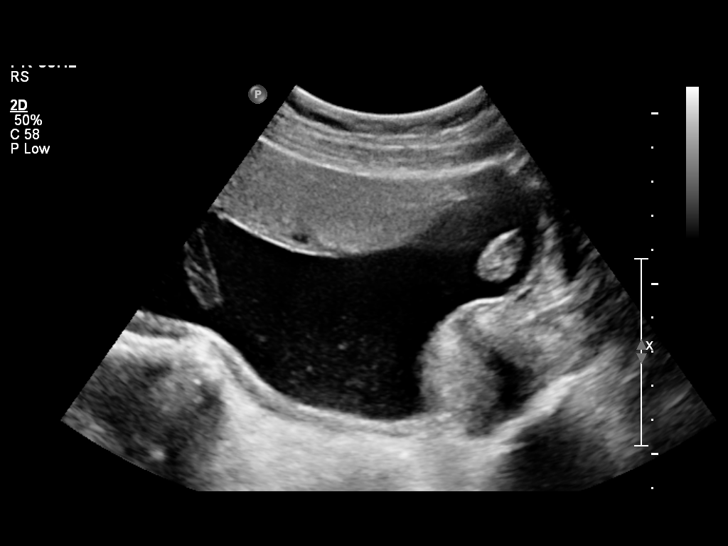
[im 9/64]
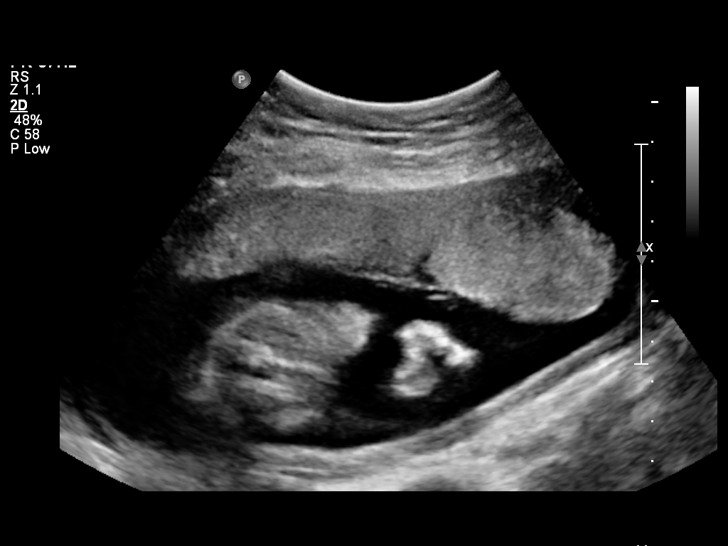
[im 15/64]
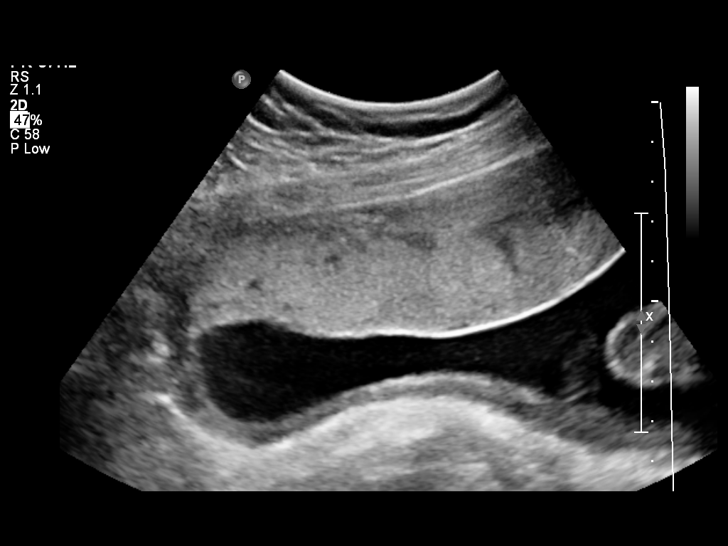
[im 21/64]
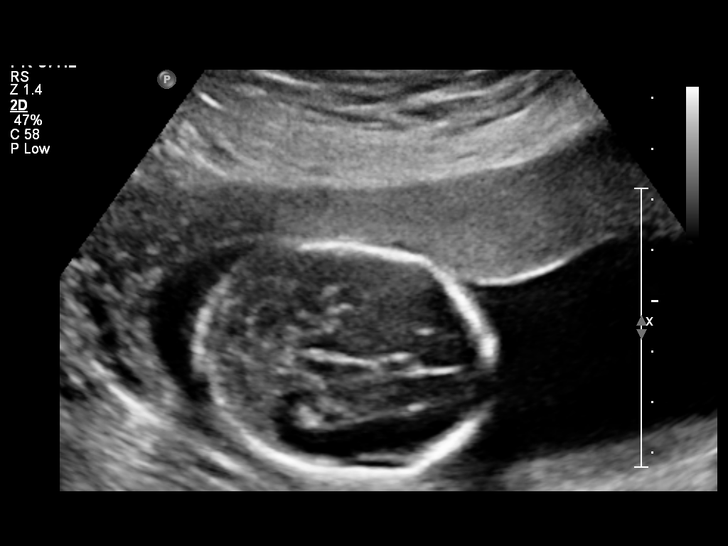
[im 29/64]
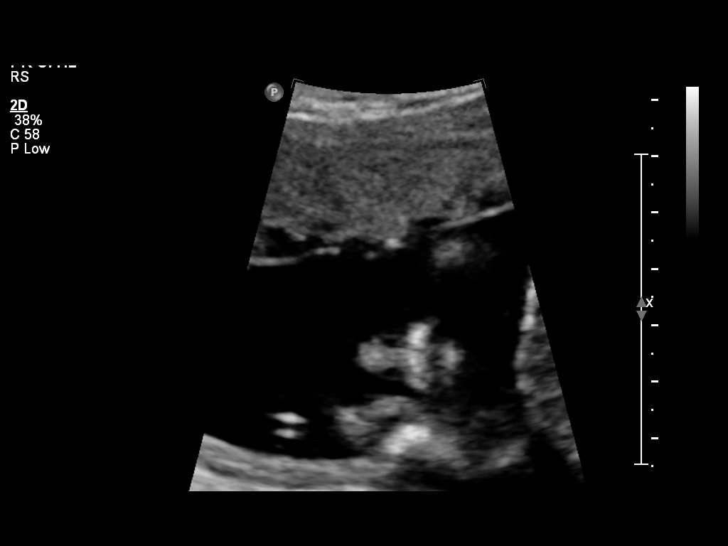
[im 35/64]
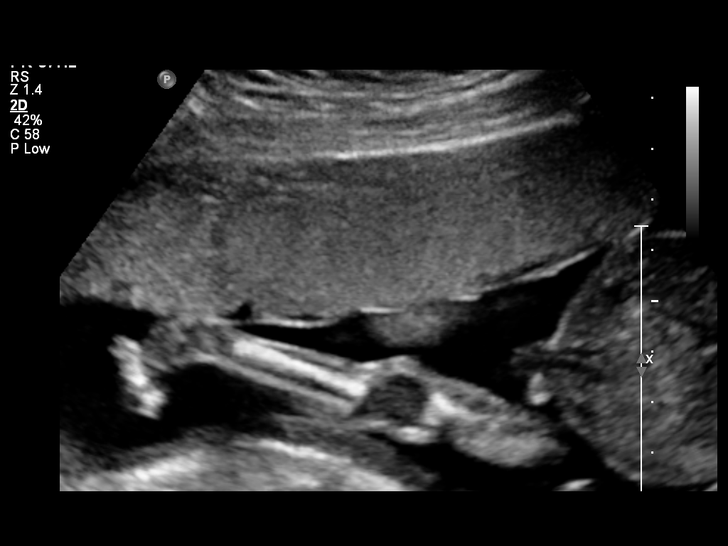
[im 41/64]
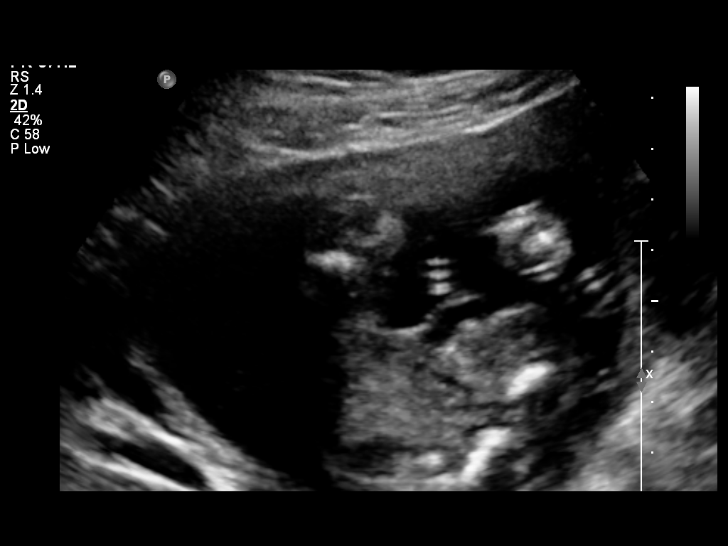
[im 49/64]
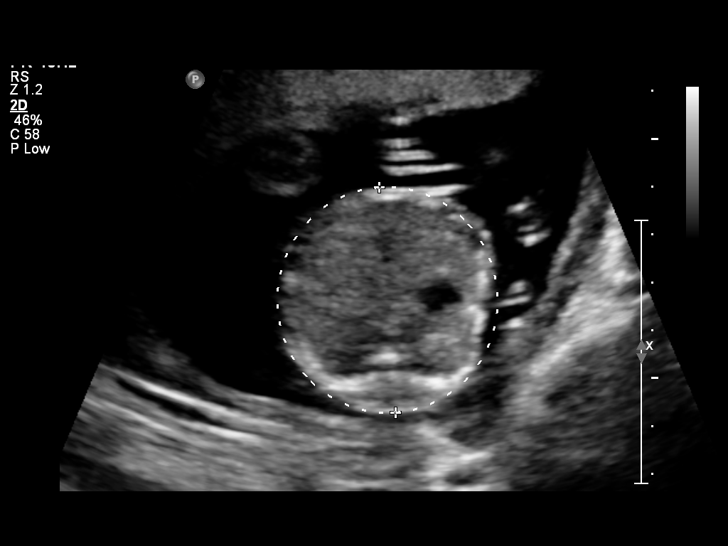
[im 55/64]
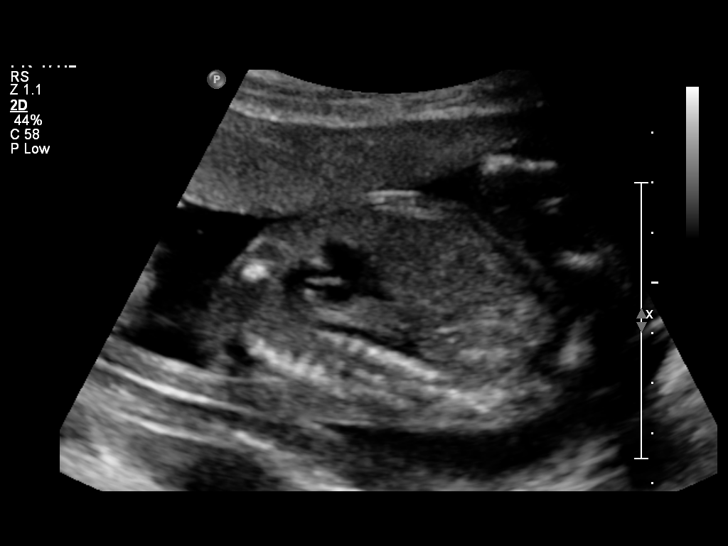
[im 61/64]
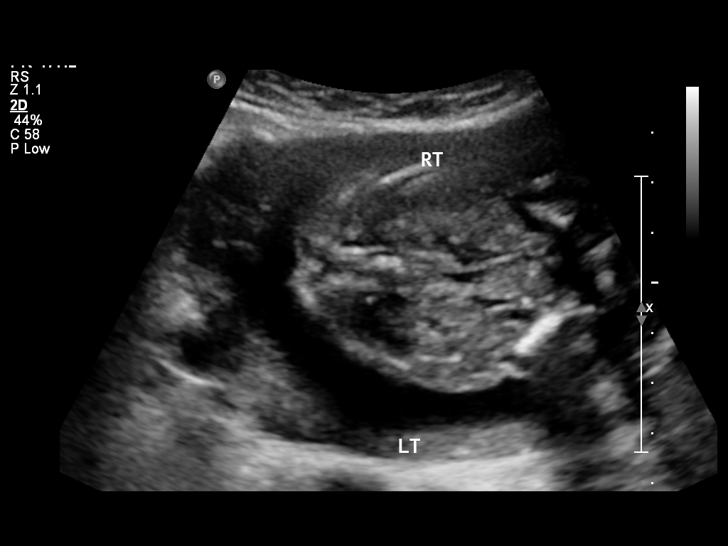

[12 of 28 positions shown; findings below may reference images not displayed]

OBSTETRICS REPORT
                      (Signed Final 02/23/2014 [DATE])

Service(s) Provided

 US OB COMP + 14 WK                                    76805.1
Indications

 Basic anatomic survey
Fetal Evaluation

 Num Of Fetuses:    1
 Fetal Heart Rate:  146                          bpm
 Cardiac Activity:  Observed
 Presentation:      Breech
 Placenta:          Anterior, above cervical os
 P. Cord            Visualized, central
 Insertion:

 Amniotic Fluid
 AFI FV:      Subjectively within normal limits
                                             Larg Pckt:    4.15  cm
 RLQ:   4.15    cm
Biometry

 BPD:     44.6  mm     G. Age:  19w 3d                CI:         70.6   70 - 86
                                                      FL/HC:      18.8   16.8 -

 HC:     169.2  mm     G. Age:  19w 4d       23  %    HC/AC:      1.15   1.09 -

 AC:     147.1  mm     G. Age:  20w 0d       45  %    FL/BPD:
 FL:      31.8  mm     G. Age:  19w 6d       39  %    FL/AC:      21.6   20 - 24
 HUM:     32.9  mm     G. Age:  21w 0d       83  %

 Est. FW:     319  gm    0 lb 11 oz      48  %
Gestational Age

 LMP:           20w 0d        Date:  10/06/13                 EDD:   07/13/14
 U/S Today:     19w 5d                                        EDD:   07/15/14
 Best:          20w 0d     Det. By:  LMP  (10/06/13)          EDD:   07/13/14
Anatomy

 Cranium:          Appears normal         Aortic Arch:      Appears normal
 Fetal Cavum:      Appears normal         Ductal Arch:      Appears normal
 Ventricles:       Appears normal         Diaphragm:        Appears normal
 Choroid Plexus:   Appears normal         Stomach:          Appears normal, left
                                                            sided
 Cerebellum:       Appears normal         Abdomen:          Appears normal
 Posterior Fossa:  Appears normal         Abdominal Wall:   Appears nml (cord
                                                            insert, abd wall)
 Nuchal Fold:      Appears normal         Cord Vessels:     Appears normal (3
                                                            vessel cord)
 Face:             Appears normal         Kidneys:          Appear normal
                   (orbits and profile)
 Lips:             Appears normal         Bladder:          Appears normal
 Heart:            Appears normal         Spine:            Appears normal
                   (4CH, axis, and
                   situs)
 RVOT:             Not well visualized    Lower             Appears normal
                                          Extremities:
 LVOT:             Appears normal         Upper             Appears normal
                                          Extremities:

 Other:  Male gender. Heels and 5th digit visualized.
Cervix Uterus Adnexa

 Cervical Length:    3.1      cm

 Cervix:       Normal appearance by transabdominal scan.
 Uterus:       No abnormality visualized.
 Left Ovary:    No adnexal mass visualized.
 Right Ovary:   No adnexal mass visualized.

 Adnexa:     No abnormality visualized.
Impression

 SIUP at 20+0 weeks
 Normal detailed fetal anatomy; limited views of right outflow
 tract
 Markers of aneuploidy: none
 Normal amniotic fluid volume
 Measurements consistent with LMP dating

 questions or concerns.

## 2015-05-10 LAB — OB RESULTS CONSOLE GC/CHLAMYDIA
Chlamydia: NEGATIVE
Gonorrhea: NEGATIVE

## 2015-05-10 LAB — OB RESULTS CONSOLE RUBELLA ANTIBODY, IGM: Rubella: IMMUNE

## 2015-05-10 LAB — OB RESULTS CONSOLE ABO/RH: RH TYPE: POSITIVE

## 2015-05-10 LAB — OB RESULTS CONSOLE RPR: RPR: NONREACTIVE

## 2015-05-10 LAB — OB RESULTS CONSOLE ANTIBODY SCREEN: Antibody Screen: NEGATIVE

## 2015-05-10 LAB — OB RESULTS CONSOLE HIV ANTIBODY (ROUTINE TESTING): HIV: NONREACTIVE

## 2015-05-10 LAB — OB RESULTS CONSOLE HEPATITIS B SURFACE ANTIGEN: HEP B S AG: NEGATIVE

## 2015-05-24 ENCOUNTER — Other Ambulatory Visit: Payer: Self-pay | Admitting: Obstetrics and Gynecology

## 2015-05-26 LAB — CYTOLOGY - PAP

## 2015-10-07 IMAGING — US US TRANSVAGINAL NON-OB
1 series · 13 of 25 positions shown · non-contrast
Comparison: 09/27/2014

CLINICAL DATA: Abnormal uterine bleeding.

EXAM:
TRANSABDOMINAL AND TRANSVAGINAL ULTRASOUND OF PELVIS
TECHNIQUE: Both transabdominal and transvaginal ultrasound examinations of the
pelvis were performed. Transabdominal technique was performed for
global imaging of the pelvis including uterus, ovaries, adnexal
regions, and pelvic cul-de-sac. It was necessary to proceed with
endovaginal exam following the transabdominal exam to visualize the
endometrium and ovaries..

[Series 1: us transvaginal non-ob · 13 of 76 slices shown]
[im 1/76]
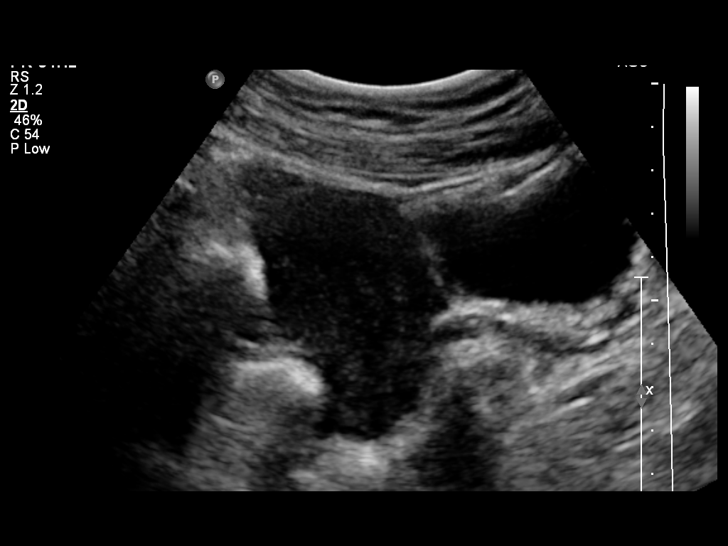
[im 7/76]
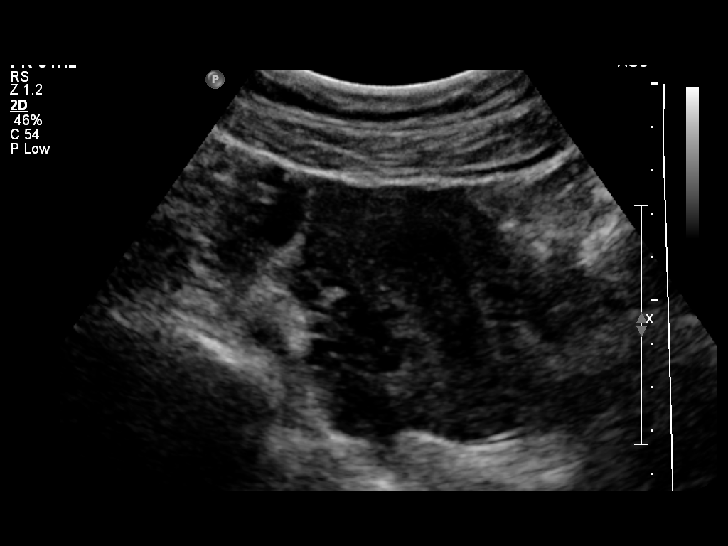
[im 13/76]
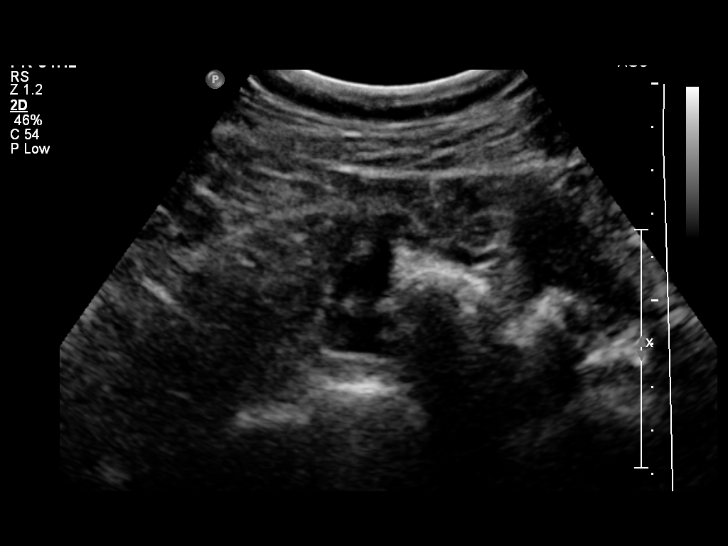
[im 19/76]
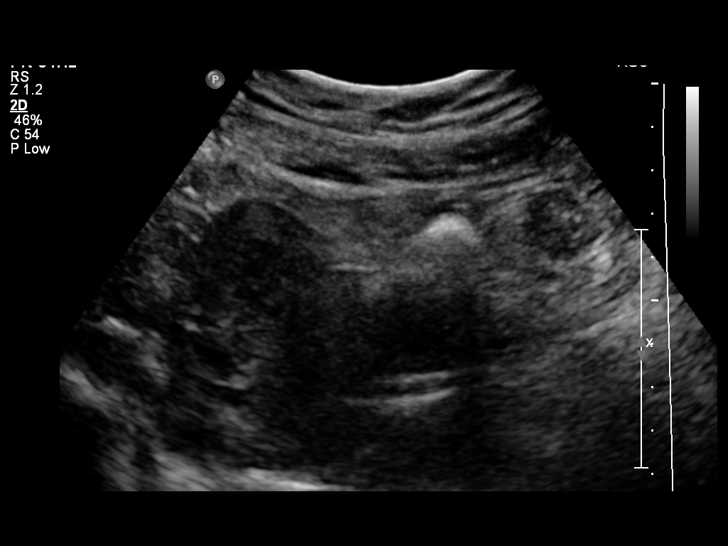
[im 26/76]
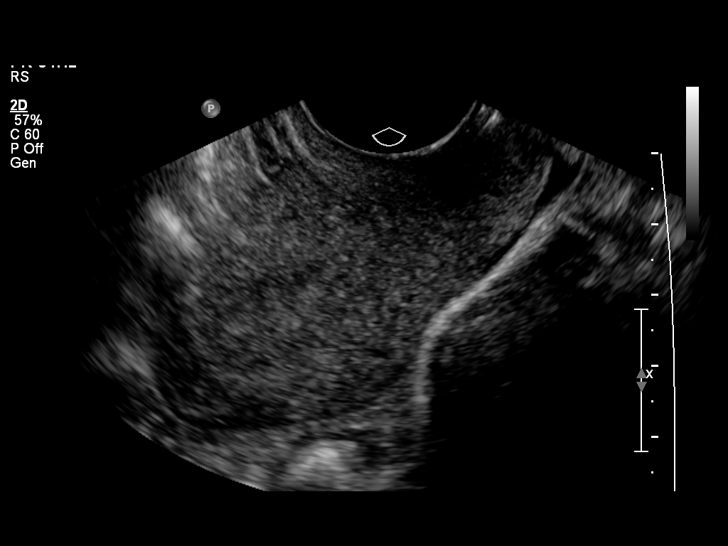
[im 32/76]
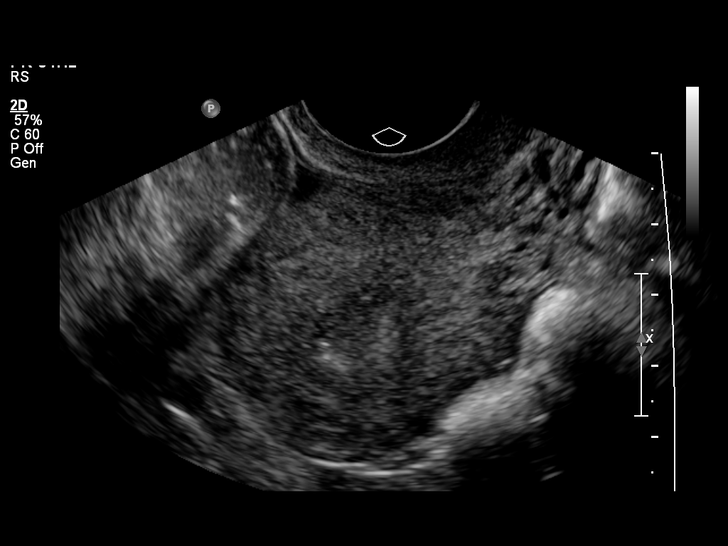
[im 38/76]
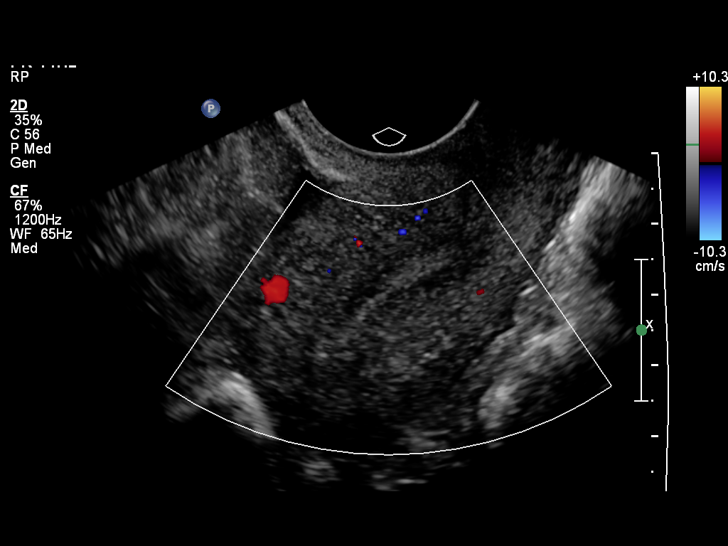
[im 44/76]
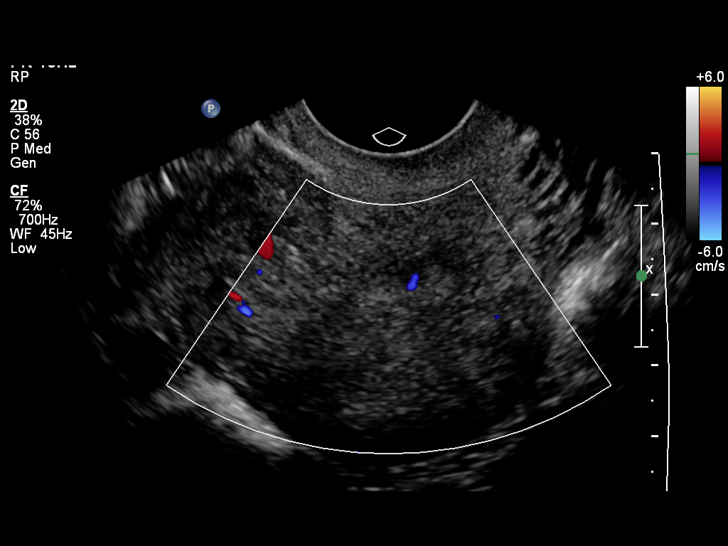
[im 51/76]
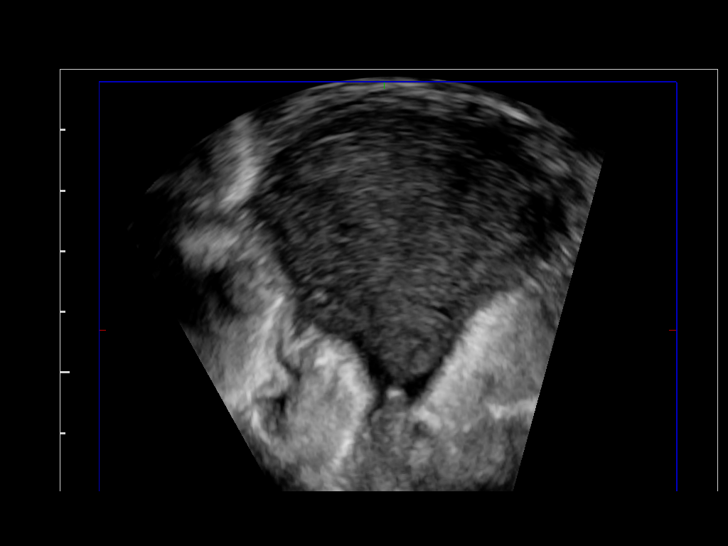
[im 57/76]
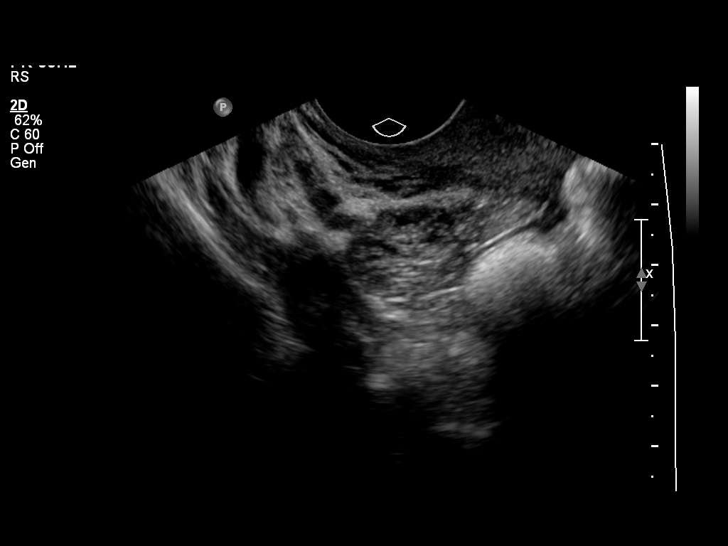
[im 63/76]
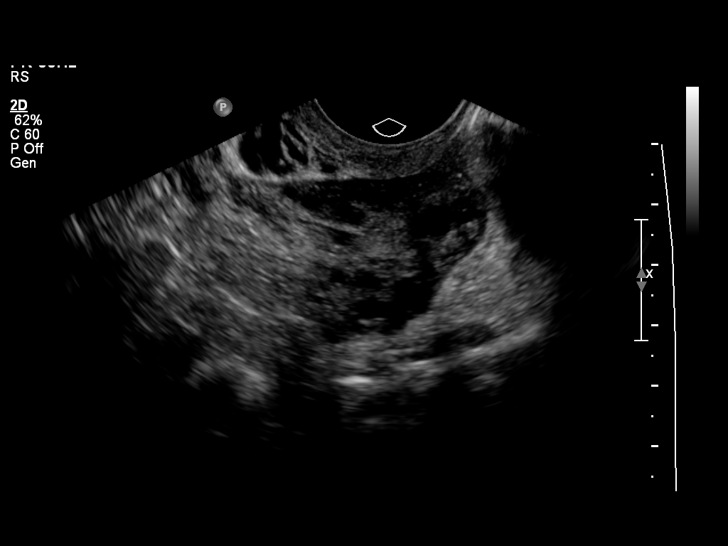
[im 69/76]
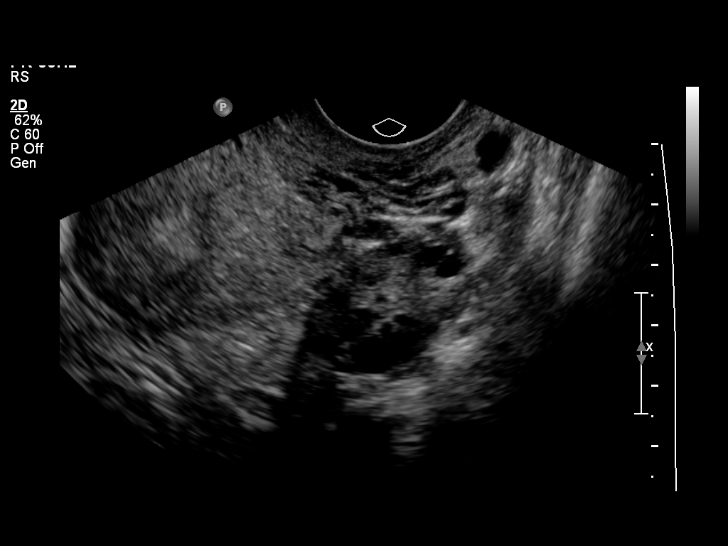
[im 76/76]
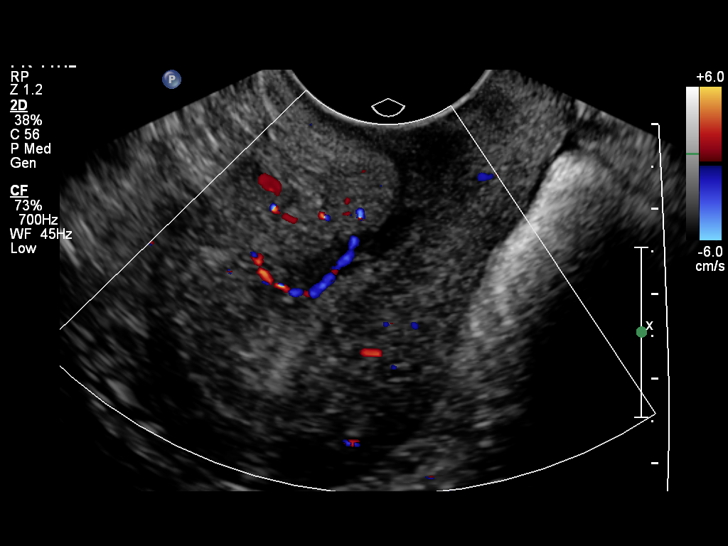

[13 of 25 positions shown; findings below may reference images not displayed]

FINDINGS: Uterus

Measurements: 6.4 x 3.2 x 4.2 cm.. No fibroids or other mass
visualized.

Endometrium

Thickness: 6.1 mm.. The endometrium appears heterogeneous with blood
flow identified anteriorly. No mass.

Right ovary

Measurements: Normal appearance/no adnexal mass.

Left ovary

Measurements: Normal appearance/no adnexal mass.

Other findings

Trace free fluid identified within the pelvis.
IMPRESSION: 1. Blood flow identified within the anterior aspect of the
endometrium is noted which is a finding which may be found with
retained products of conception. Normal endometrial thickness,
measuring 6 mm. No mass identified within the endometrium.
2. These results will be called to the ordering clinician or
representative by the Radiologist Assistant, and communication
documented in the PACS or zVision Dashboard.

## 2015-10-30 IMAGING — CR DG HIP (WITH OR WITHOUT PELVIS) 2-3V*L*
3 series · 3 of 3 positions shown · non-contrast
Comparison: None.

CLINICAL DATA: New left hip pain and left groin pain. No known
trauma.

EXAM:
LEFT HIP - COMPLETE 2+ VIEW

[t pelvis ap]
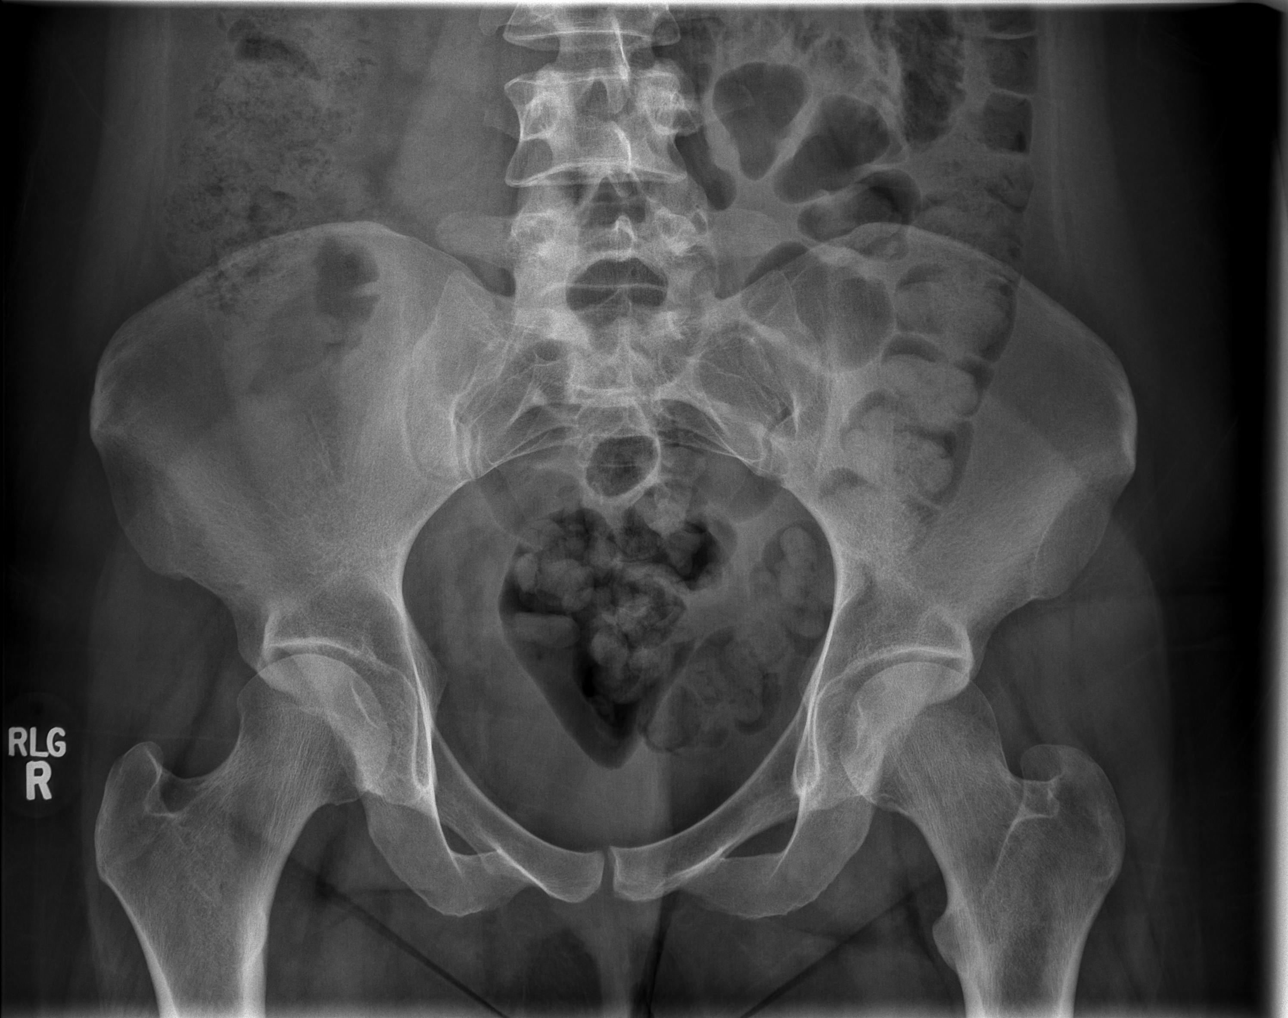

[t hip ap left]
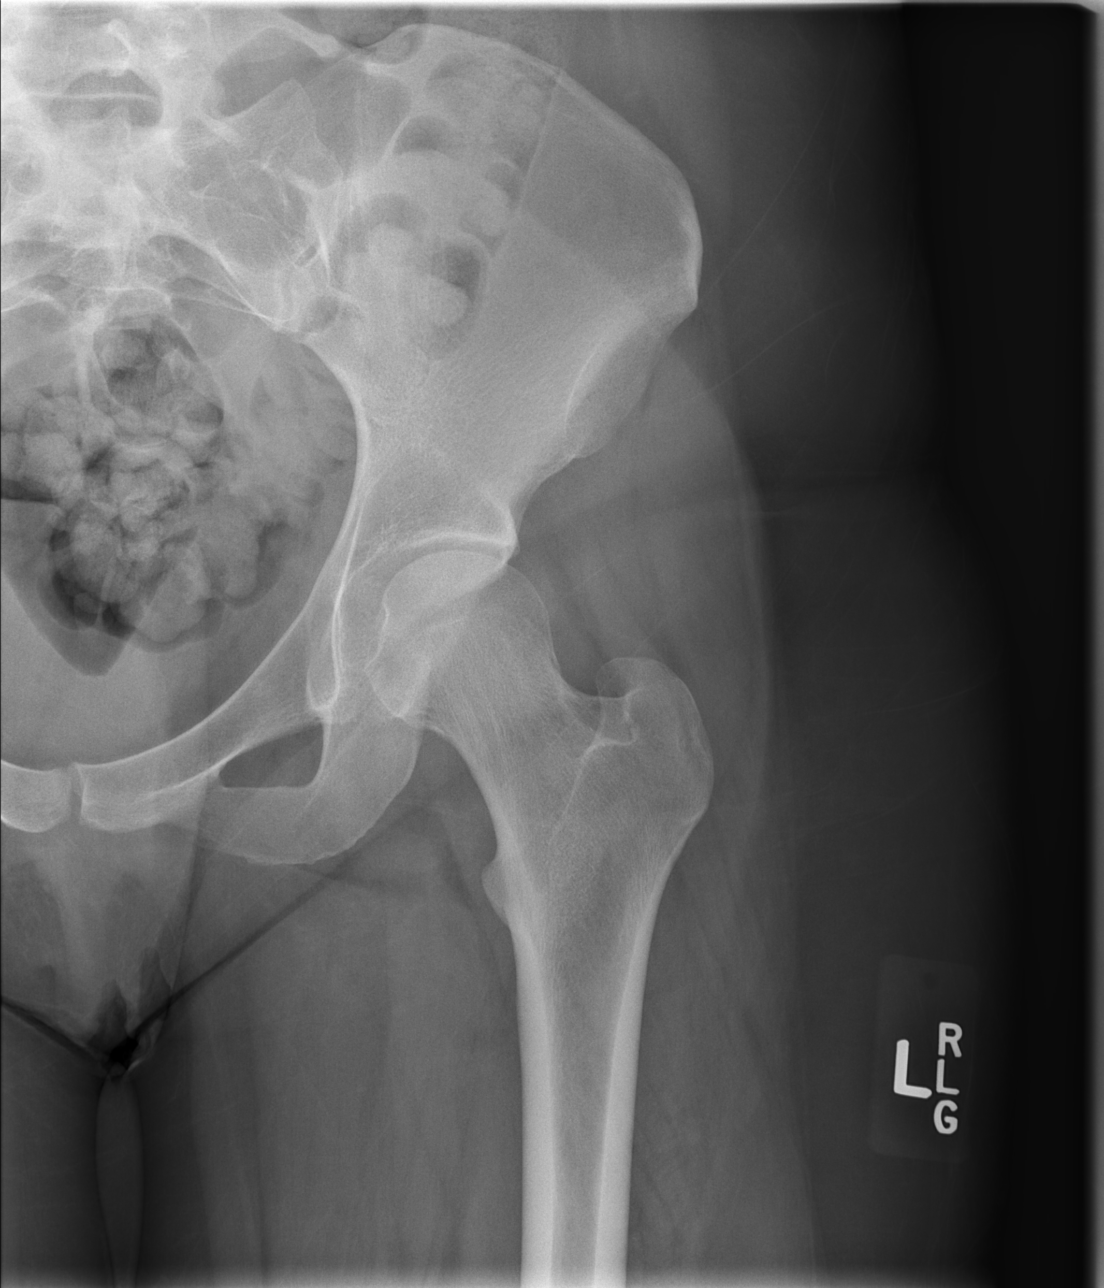

[t hip frog leg left]
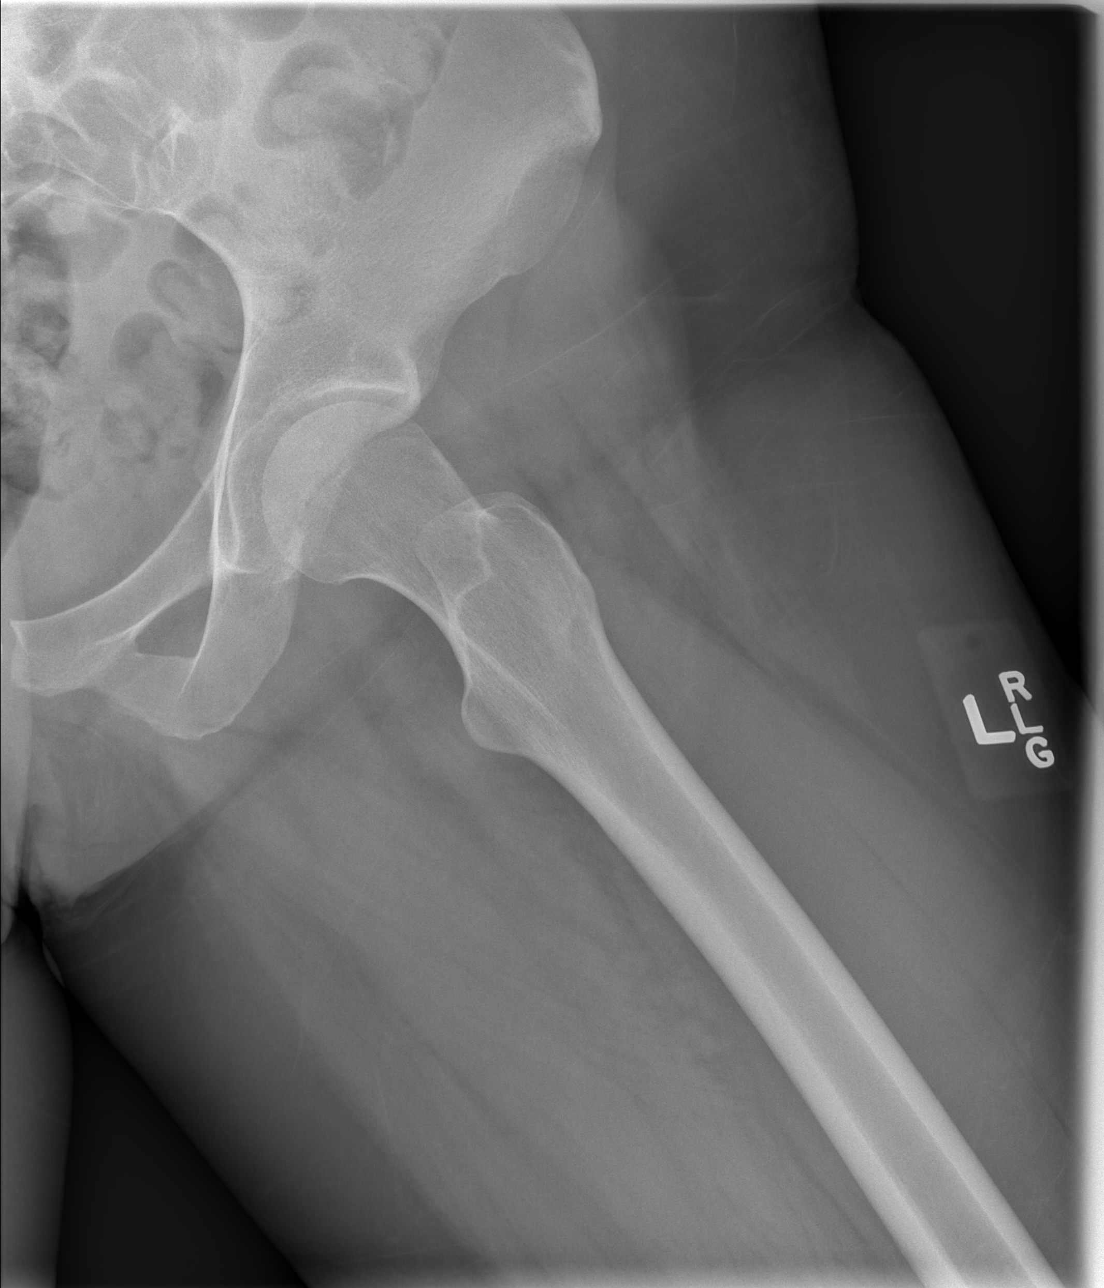

[3 of 3 positions shown; findings below may reference images not displayed]

FINDINGS: Pelvis appears intact. No displaced fractures identified. SI joints
and symphysis pubis are not displaced. Left hip appears intact. No
evidence of acute fracture or dislocation. No focal bone lesion or
bone destruction. Soft tissues are unremarkable.
IMPRESSION: Negative.

## 2015-11-13 ENCOUNTER — Encounter (HOSPITAL_COMMUNITY): Payer: Self-pay | Admitting: *Deleted

## 2015-11-13 ENCOUNTER — Inpatient Hospital Stay (HOSPITAL_COMMUNITY)
Admission: AD | Admit: 2015-11-13 | Discharge: 2015-11-13 | Disposition: A | Discharge status: Home / Self Care | Payer: Medicaid Other | Source: Ambulatory Visit | Attending: Obstetrics and Gynecology | Admitting: Obstetrics and Gynecology

## 2015-11-13 DIAGNOSIS — O26893 Other specified pregnancy related conditions, third trimester: Secondary | ICD-10-CM | POA: Insufficient documentation

## 2015-11-13 DIAGNOSIS — Z3A36 36 weeks gestation of pregnancy: Secondary | ICD-10-CM | POA: Diagnosis not present

## 2015-11-13 LAB — POCT FERN TEST: POCT Fern Test: NEGATIVE

## 2015-11-13 NOTE — MAU Provider Note (Signed)
  History     CSN: [id number]  Arrival date and time: 11/13/15 1312   First Provider Initiated Contact with Patient 11/13/15 1440      Chief Complaint  Patient presents with  . Rupture of Membranes   HPI Samantha Ortiz 24 y.o. 644w6d  Comes to MAU as she had a gush of fluid at home and thought her water broke.  She has not continued to have leaking.  Baby is moving well.  OB History    Gravida Para Term Preterm AB TAB SAB Ectopic Multiple Living   2 1 1  0 0 0 0 0 0 1      Past Medical History  Diagnosis Date  . Medical history non-contributory     Past Surgical History  Procedure Laterality Date  . No past surgeries      No family history on file.  Social History  Substance Use Topics  . Smoking status: Never Smoker   . Smokeless tobacco: Never Used  . Alcohol Use: No    Allergies: No Known Allergies  Prescriptions prior to admission  Medication Sig Dispense Refill Last Dose  . ibuprofen (ADVIL,MOTRIN) 600 MG tablet Take 1 tablet (600 mg total) by mouth every 8 (eight) hours as needed. (Patient not taking: Reported on 11/13/2015) 15 tablet 0 Not Taking at Unknown time  . ondansetron (ZOFRAN ODT) 8 MG disintegrating tablet Take 1 tablet (8 mg total) by mouth every 8 (eight) hours as needed for nausea or vomiting. (Patient not taking: Reported on 11/13/2015) 10 tablet 0 Not Taking at Unknown time  . oxyCODONE-acetaminophen (PERCOCET/ROXICET) 5-325 MG per tablet Take 1 tablet by mouth every 4 (four) hours as needed for severe pain. (Patient not taking: Reported on 11/13/2015) 15 tablet 0 Not Taking at Unknown time    Review of Systems  Constitutional: Negative for fever.  Gastrointestinal: Negative for abdominal pain.  Genitourinary: Negative for dysuria.       No vaginal discharge. No vaginal bleeding. Leaking of fluid from vagina.   Physical Exam   Blood pressure 119/75, pulse 106, temperature 97.9 F (36.6 C), temperature source Oral, resp. rate 18, currently  breastfeeding.  Physical Exam  Nursing note and vitals reviewed. Constitutional: She is oriented to person, place, and time. She appears well-developed and well-nourished.  HENT:  Head: Normocephalic.  Eyes: EOM are normal.  Neck: Neck supple.  GI: Soft. There is no tenderness.  No contractions felt by patient and none seen on monitor strip.  FHT is reactive.  Genitourinary:  Speculum exam: Vulva - no leaking seen Vagina - Small amount of creamy discharge, thick cervical mucus seen, no pooling and none with valsalva Fern slide obtained for RN to read. Chaperone present for exam.  Musculoskeletal: Normal range of motion.  Neurological: She is alert and oriented to person, place, and time.  Skin: Skin is warm and dry.  Psychiatric: She has a normal mood and affect.    MAU Course  Procedures  MDM Crist FatFern slide read by RN - negative.  RN called Dr. Renaldo FiddlerAdkins and client was discharged.  Assessment and Plan  36weeks - water is not broken  Plan  Will be seen in office tomorrow at regular appointment. Return if develops fever, labor or more leaking fluid.  Samantha Ortiz 11/13/2015, 2:53 PM

## 2015-11-13 NOTE — MAU Note (Signed)
Pt had gush of fluid today @ 1130, clear.  Does not feel leaking now.  Denies uc's or bleeding.

## 2015-11-13 NOTE — MAU Note (Signed)
Pt reports LOF @ 1130 which she describes as a big gush, then a little leaking 20-30 minutes later. Since that time, no further leaking.

## 2015-11-13 NOTE — Discharge Instructions (Signed)
Fetal Movement Counts Patient Name: __________________________________________________ Patient Due Date: ____________________ Performing a fetal movement count is highly recommended in high-risk pregnancies, but it is good for every pregnant woman to do. Your health care provider may ask you to start counting fetal movements at 28 weeks of the pregnancy. Fetal movements often increase:  After eating a full meal.  After physical activity.  After eating or drinking something sweet or cold.  At rest. Pay attention to when you feel the baby is most active. This will help you notice a pattern of your baby's sleep and wake cycles and what factors contribute to an increase in fetal movement. It is important to perform a fetal movement count at the same time each day when your baby is normally most active.  HOW TO COUNT FETAL MOVEMENTS 1. Find a quiet and comfortable area to sit or lie down on your left side. Lying on your left side provides the best blood and oxygen circulation to your baby. 2. Write down the day and time on a sheet of paper or in a journal. 3. Start counting kicks, flutters, swishes, rolls, or jabs in a 2-hour period. You should feel at least 10 movements within 2 hours. 4. If you do not feel 10 movements in 2 hours, wait 2-3 hours and count again. Look for a change in the pattern or not enough counts in 2 hours. SEEK MEDICAL CARE IF:  You feel less than 10 counts in 2 hours, tried twice.  There is no movement in over an hour.  The pattern is changing or taking longer each day to reach 10 counts in 2 hours.  You feel the baby is not moving as he or she usually does. Date: ____________ Movements: ____________ Start time: ____________ Finish time: ____________  Date: ____________ Movements: ____________ Start time: ____________ Finish time: ____________ Date: ____________ Movements: ____________ Start time: ____________ Finish time: ____________ Date: ____________ Movements:  ____________ Start time: ____________ Finish time: ____________ Date: ____________ Movements: ____________ Start time: ____________ Finish time: ____________ Date: ____________ Movements: ____________ Start time: ____________ Finish time: ____________ Date: ____________ Movements: ____________ Start time: ____________ Finish time: ____________ Date: ____________ Movements: ____________ Start time: ____________ Finish time: ____________  Date: ____________ Movements: ____________ Start time: ____________ Finish time: ____________ Date: ____________ Movements: ____________ Start time: ____________ Finish time: ____________ Date: ____________ Movements: ____________ Start time: ____________ Finish time: ____________ Date: ____________ Movements: ____________ Start time: ____________ Finish time: ____________ Date: ____________ Movements: ____________ Start time: ____________ Finish time: ____________ Date: ____________ Movements: ____________ Start time: ____________ Finish time: ____________ Date: ____________ Movements: ____________ Start time: ____________ Finish time: ____________  Date: ____________ Movements: ____________ Start time: ____________ Finish time: ____________ Date: ____________ Movements: ____________ Start time: ____________ Finish time: ____________ Date: ____________ Movements: ____________ Start time: ____________ Finish time: ____________ Date: ____________ Movements: ____________ Start time: ____________ Finish time: ____________ Date: ____________ Movements: ____________ Start time: ____________ Finish time: ____________ Date: ____________ Movements: ____________ Start time: ____________ Finish time: ____________ Date: ____________ Movements: ____________ Start time: ____________ Finish time: ____________  Date: ____________ Movements: ____________ Start time: ____________ Finish time: ____________ Date: ____________ Movements: ____________ Start time: ____________ Finish  time: ____________ Date: ____________ Movements: ____________ Start time: ____________ Finish time: ____________ Date: ____________ Movements: ____________ Start time: ____________ Finish time: ____________ Date: ____________ Movements: ____________ Start time: ____________ Finish time: ____________ Date: ____________ Movements: ____________ Start time: ____________ Finish time: ____________ Date: ____________ Movements: ____________ Start time: ____________ Finish time: ____________  Date: ____________ Movements: ____________ Start time: ____________ Finish   time: ____________ Date: ____________ Movements: ____________ Start time: ____________ Finish time: ____________ Date: ____________ Movements: ____________ Start time: ____________ Finish time: ____________ Date: ____________ Movements: ____________ Start time: ____________ Finish time: ____________ Date: ____________ Movements: ____________ Start time: ____________ Finish time: ____________ Date: ____________ Movements: ____________ Start time: ____________ Finish time: ____________ Date: ____________ Movements: ____________ Start time: ____________ Finish time: ____________  Date: ____________ Movements: ____________ Start time: ____________ Finish time: ____________ Date: ____________ Movements: ____________ Start time: ____________ Finish time: ____________ Date: ____________ Movements: ____________ Start time: ____________ Finish time: ____________ Date: ____________ Movements: ____________ Start time: ____________ Finish time: ____________ Date: ____________ Movements: ____________ Start time: ____________ Finish time: ____________ Date: ____________ Movements: ____________ Start time: ____________ Finish time: ____________ Date: ____________ Movements: ____________ Start time: ____________ Finish time: ____________  Date: ____________ Movements: ____________ Start time: ____________ Finish time: ____________ Date: ____________  Movements: ____________ Start time: ____________ Finish time: ____________ Date: ____________ Movements: ____________ Start time: ____________ Finish time: ____________ Date: ____________ Movements: ____________ Start time: ____________ Finish time: ____________ Date: ____________ Movements: ____________ Start time: ____________ Finish time: ____________ Date: ____________ Movements: ____________ Start time: ____________ Finish time: ____________ Date: ____________ Movements: ____________ Start time: ____________ Finish time: ____________  Date: ____________ Movements: ____________ Start time: ____________ Finish time: ____________ Date: ____________ Movements: ____________ Start time: ____________ Finish time: ____________ Date: ____________ Movements: ____________ Start time: ____________ Finish time: ____________ Date: ____________ Movements: ____________ Start time: ____________ Finish time: ____________ Date: ____________ Movements: ____________ Start time: ____________ Finish time: ____________ Date: ____________ Movements: ____________ Start time: ____________ Finish time: ____________   This information is not intended to replace advice given to you by your health care provider. Make sure you discuss any questions you have with your health care provider.   Document Released: 12/19/2006 Document Revised: 12/10/2014 Document Reviewed: 09/15/2012 Elsevier Interactive Patient Education 2016 [employer] Braxton Hicks Contractions Contractions of the uterus can occur throughout pregnancy. Contractions are not always a sign that you are in labor.  WHAT ARE BRAXTON HICKS CONTRACTIONS?  Contractions that occur before labor are called Braxton Hicks contractions, or false labor. Toward the end of pregnancy (32-34 weeks), these contractions can develop more often and may become more forceful. This is not true labor because these contractions do not result in opening (dilatation) and thinning of  the cervix. They are sometimes difficult to tell apart from true labor because these contractions can be forceful and people have different pain tolerances. You should not feel embarrassed if you go to the hospital with false labor. Sometimes, the only way to tell if you are in true labor is for your health care provider to look for changes in the cervix. If there are no prenatal problems or other health problems associated with the pregnancy, it is completely safe to be sent home with false labor and await the onset of true labor. HOW CAN YOU TELL THE DIFFERENCE BETWEEN TRUE AND FALSE LABOR? False Labor  The contractions of false labor are usually shorter and not as hard as those of true labor.   The contractions are usually irregular.   The contractions are often felt in the front of the lower abdomen and in the groin.   The contractions may go away when you walk around or change positions while lying down.   The contractions get weaker and are shorter lasting as time goes on.   The contractions do not usually become progressively stronger, regular, and closer together as with true labor.  True Labor 5. Contractions in true   labor last 30-70 seconds, become very regular, usually become more intense, and increase in frequency.  6. The contractions do not go away with walking.  7. The discomfort is usually felt in the top of the uterus and spreads to the lower abdomen and low back.  8. True labor can be determined by your health care provider with an exam. This will show that the cervix is dilating and getting thinner.  WHAT TO REMEMBER  Keep up with your usual exercises and follow other instructions given by your health care provider.   Take medicines as directed by your health care provider.   Keep your regular prenatal appointments.   Eat and drink lightly if you think you are going into labor.   If Braxton Hicks contractions are making you uncomfortable:   Change  your position from lying down or resting to walking, or from walking to resting.   Sit and rest in a tub of warm water.   Drink 2-3 glasses of water. Dehydration may cause these contractions.   Do slow and deep breathing several times an hour.  WHEN SHOULD I SEEK IMMEDIATE MEDICAL CARE? Seek immediate medical care if:  Your contractions become stronger, more regular, and closer together.   You have fluid leaking or gushing from your vagina.   You have a fever.   You pass blood-tinged mucus.   You have vaginal bleeding.   You have continuous abdominal pain.   You have low back pain that you never had before.   You feel your baby's head pushing down and causing pelvic pressure.   Your baby is not moving as much as it used to.    This information is not intended to replace advice given to you by your health care provider. Make sure you discuss any questions you have with your health care provider.   Document Released: 11/19/2005 Document Revised: 11/24/2013 Document Reviewed: 08/31/2013 Elsevier Interactive Patient Education 2016 Elsevier Inc[employer]  

## 2015-12-04 NOTE — L&D Delivery Note (Signed)
Delivery Note At 10:01 AM a viable female was delivered via Vaginal, Spontaneous Delivery (Presentation: ;  ).  APGAR: , ; weight  .   Placenta status: , .  Cord:  with the following complications: .  Cord pH: not sent  Anesthesia: Other  Episiotomy:  none Lacerations:  Small sec deg Suture Repair: 3.0 vicryl rapide Est. Blood Loss (mL):  250  Mom to postpartum.  Baby to Couplet care / Skin to Skin.  Slade Pierpoint M 12/13/2015, 10:13 AM

## 2015-12-13 ENCOUNTER — Encounter (HOSPITAL_COMMUNITY): Payer: Self-pay

## 2015-12-13 ENCOUNTER — Inpatient Hospital Stay (HOSPITAL_COMMUNITY)
Admission: AD | Admit: 2015-12-13 | Discharge: 2015-12-14 | DRG: 775 | Disposition: A | Discharge status: Home / Self Care | Payer: Medicaid Other | Source: Ambulatory Visit | Attending: Obstetrics and Gynecology | Admitting: Obstetrics and Gynecology

## 2015-12-13 DIAGNOSIS — Z3A41 41 weeks gestation of pregnancy: Secondary | ICD-10-CM | POA: Diagnosis not present

## 2015-12-13 DIAGNOSIS — O480 Post-term pregnancy: Principal | ICD-10-CM | POA: Diagnosis present

## 2015-12-13 LAB — CBC
HCT: 37.4 % (ref 36.0–46.0)
HEMOGLOBIN: 12 g/dL (ref 12.0–15.0)
MCH: 25.4 pg — AB (ref 26.0–34.0)
MCHC: 32.1 g/dL (ref 30.0–36.0)
MCV: 79.1 fL (ref 78.0–100.0)
Platelets: 180 10*3/uL (ref 150–400)
RBC: 4.73 MIL/uL (ref 3.87–5.11)
RDW: 14.8 % (ref 11.5–15.5)
WBC: 17 10*3/uL — ABNORMAL HIGH (ref 4.0–10.5)

## 2015-12-13 LAB — TYPE AND SCREEN
ABO/RH(D): A POS
ANTIBODY SCREEN: NEGATIVE

## 2015-12-13 LAB — OB RESULTS CONSOLE GBS: GBS: NEGATIVE

## 2015-12-13 LAB — RPR: RPR Ser Ql: NONREACTIVE

## 2015-12-13 MED ORDER — ONDANSETRON HCL 4 MG/2ML IJ SOLN: 4.0000 mg | Freq: Four times a day (QID) | INTRAMUSCULAR | Status: DC | PRN

## 2015-12-13 MED ORDER — OXYCODONE-ACETAMINOPHEN 5-325 MG PO TABS
2.0000 | ORAL_TABLET | ORAL | Status: DC | PRN
Start: 2015-12-13 — End: 2015-12-13

## 2015-12-13 MED ORDER — CITRIC ACID-SODIUM CITRATE 334-500 MG/5ML PO SOLN: 30.0000 mL | ORAL | Status: DC | PRN

## 2015-12-13 MED ORDER — FLEET ENEMA 7-19 GM/118ML RE ENEM: 1.0000 | ENEMA | Freq: Every day | RECTAL | Status: DC | PRN

## 2015-12-13 MED ORDER — BUTORPHANOL TARTRATE 1 MG/ML IJ SOLN
1.0000 mg | INTRAMUSCULAR | Status: DC | PRN
  Administered 2015-12-13 (×2): 1 mg via INTRAVENOUS
  Filled 2015-12-13 (×2): qty 1

## 2015-12-13 MED ORDER — TETANUS-DIPHTH-ACELL PERTUSSIS 5-2.5-18.5 LF-MCG/0.5 IM SUSP: 0.5000 mL | Freq: Once | INTRAMUSCULAR | Status: DC

## 2015-12-13 MED ORDER — ACETAMINOPHEN 325 MG PO TABS: 650.0000 mg | ORAL_TABLET | ORAL | Status: DC | PRN

## 2015-12-13 MED ORDER — ACETAMINOPHEN 325 MG PO TABS
650.0000 mg | ORAL_TABLET | ORAL | Status: DC | PRN
  Administered 2015-12-13 – 2015-12-14 (×2): 650 mg via ORAL
  Filled 2015-12-13 (×2): qty 2

## 2015-12-13 MED ORDER — ONDANSETRON HCL 4 MG/2ML IJ SOLN: 4.0000 mg | INTRAMUSCULAR | Status: DC | PRN

## 2015-12-13 MED ORDER — PHENYLEPHRINE 40 MCG/ML (10ML) SYRINGE FOR IV PUSH (FOR BLOOD PRESSURE SUPPORT)
80.0000 ug | PREFILLED_SYRINGE | INTRAVENOUS | Status: DC | PRN
  Filled 2015-12-13: qty 2

## 2015-12-13 MED ORDER — TERBUTALINE SULFATE 1 MG/ML IJ SOLN
0.2500 mg | Freq: Once | INTRAMUSCULAR | Status: DC | PRN
  Filled 2015-12-13: qty 1

## 2015-12-13 MED ORDER — BENZOCAINE-MENTHOL 20-0.5 % EX AERO
1.0000 "application " | INHALATION_SPRAY | CUTANEOUS | Status: DC | PRN
  Filled 2015-12-13: qty 56

## 2015-12-13 MED ORDER — SENNOSIDES-DOCUSATE SODIUM 8.6-50 MG PO TABS
2.0000 | ORAL_TABLET | ORAL | Status: DC
  Administered 2015-12-14: 2 via ORAL
  Filled 2015-12-13: qty 2

## 2015-12-13 MED ORDER — DIBUCAINE 1 % RE OINT: 1.0000 "application " | TOPICAL_OINTMENT | RECTAL | Status: DC | PRN

## 2015-12-13 MED ORDER — MEASLES, MUMPS & RUBELLA VAC ~~LOC~~ INJ
0.5000 mL | INJECTION | Freq: Once | SUBCUTANEOUS | Status: DC
  Filled 2015-12-13: qty 0.5

## 2015-12-13 MED ORDER — LACTATED RINGERS IV SOLN: INTRAVENOUS | Status: DC

## 2015-12-13 MED ORDER — ONDANSETRON HCL 4 MG PO TABS: 4.0000 mg | ORAL_TABLET | ORAL | Status: DC | PRN

## 2015-12-13 MED ORDER — DIPHENHYDRAMINE HCL 25 MG PO CAPS: 25.0000 mg | ORAL_CAPSULE | Freq: Four times a day (QID) | ORAL | Status: DC | PRN

## 2015-12-13 MED ORDER — LIDOCAINE HCL (PF) 1 % IJ SOLN
30.0000 mL | INTRAMUSCULAR | Status: DC | PRN
  Administered 2015-12-13: 30 mL via SUBCUTANEOUS
  Filled 2015-12-13: qty 30

## 2015-12-13 MED ORDER — WITCH HAZEL-GLYCERIN EX PADS: 1.0000 "application " | MEDICATED_PAD | CUTANEOUS | Status: DC | PRN

## 2015-12-13 MED ORDER — OXYTOCIN BOLUS FROM INFUSION: 500.0000 mL | INTRAVENOUS | Status: DC

## 2015-12-13 MED ORDER — OXYCODONE-ACETAMINOPHEN 5-325 MG PO TABS: 1.0000 | ORAL_TABLET | ORAL | Status: DC | PRN

## 2015-12-13 MED ORDER — ZOLPIDEM TARTRATE 5 MG PO TABS: 5.0000 mg | ORAL_TABLET | Freq: Every evening | ORAL | Status: DC | PRN

## 2015-12-13 MED ORDER — IBUPROFEN 800 MG PO TABS
800.0000 mg | ORAL_TABLET | Freq: Three times a day (TID) | ORAL | Status: DC | PRN
  Administered 2015-12-13 – 2015-12-14 (×3): 800 mg via ORAL
  Filled 2015-12-13 (×3): qty 1

## 2015-12-13 MED ORDER — SIMETHICONE 80 MG PO CHEW: 80.0000 mg | CHEWABLE_TABLET | ORAL | Status: DC | PRN

## 2015-12-13 MED ORDER — PRENATAL MULTIVITAMIN CH: 1.0000 | ORAL_TABLET | Freq: Every day | ORAL | Status: DC

## 2015-12-13 MED ORDER — OXYTOCIN 10 UNIT/ML IJ SOLN
1.0000 m[IU]/min | INTRAVENOUS | Status: DC
  Administered 2015-12-13: 2 m[IU]/min via INTRAVENOUS

## 2015-12-13 MED ORDER — DIPHENHYDRAMINE HCL 50 MG/ML IJ SOLN: 12.5000 mg | INTRAMUSCULAR | Status: DC | PRN

## 2015-12-13 MED ORDER — BISACODYL 10 MG RE SUPP: 10.0000 mg | Freq: Every day | RECTAL | Status: DC | PRN

## 2015-12-13 MED ORDER — LANOLIN HYDROUS EX OINT: TOPICAL_OINTMENT | CUTANEOUS | Status: DC | PRN

## 2015-12-13 MED ORDER — OXYTOCIN 10 UNIT/ML IJ SOLN
2.5000 [IU]/h | INTRAMUSCULAR | Status: DC
  Filled 2015-12-13: qty 4

## 2015-12-13 MED ORDER — FENTANYL 2.5 MCG/ML BUPIVACAINE 1/10 % EPIDURAL INFUSION (WH - ANES): 14.0000 mL/h | INTRAMUSCULAR | Status: DC | PRN

## 2015-12-13 MED ORDER — EPHEDRINE 5 MG/ML INJ
10.0000 mg | INTRAVENOUS | Status: DC | PRN
  Filled 2015-12-13: qty 2

## 2015-12-13 MED ORDER — LACTATED RINGERS IV SOLN: 500.0000 mL | INTRAVENOUS | Status: DC | PRN

## 2015-12-13 NOTE — H&P (Signed)
Samantha Ortiz is a 25 y.o. female G2P1 @ 41+1 wks presenting for SOL.  Pregnancy uncomplicated.  History OB History    Gravida Para Term Preterm AB TAB SAB Ectopic Multiple Living   2 1 1  0 0 0 0 0 0 1     Past Medical History  Diagnosis Date  . Medical history non-contributory    Past Surgical History  Procedure Laterality Date  . No past surgeries     Family History: family history is not on file. Social History:  reports that she has never smoked. She has never used smokeless tobacco. She reports that she does not drink alcohol or use illicit drugs.   Prenatal Transfer Tool  Maternal Diabetes: No Genetic Screening: Declined Maternal Ultrasounds/Referrals: Normal Fetal Ultrasounds or other Referrals:  None Maternal Substance Abuse:  No Significant Maternal Medications:  None Significant Maternal Lab Results:  None Other Comments:  None  ROS  Dilation: 6 Effacement (%): 80 Station: -2 Exam by:: Ciscomber Stovall RN Blood pressure 124/72, pulse 110, resp. rate 18, height 5\' 8"  (1.727 m), weight 198 lb 3.2 oz (89.903 kg), SpO2 100 %, currently breastfeeding. Exam Physical Exam  Gen - NAD Abd - gravid, NT  EFW 8# Ext - NT, no edema  Prenatal labs: ABO, Rh:   Antibody:   Rubella:   RPR:    HBsAg:    HIV:    GBS:   negative  Assessment/Plan: Labor Epidural prn  Exp mngt  Samantha Ortiz 12/13/2015, 6:29 AM

## 2015-12-13 NOTE — MAU Note (Signed)
Pt c/o contractions since 10pm every 5 mins. Denies LOF or vag bleeding. +FM. Cervix was 3cm 1 week ago.

## 2015-12-13 NOTE — Lactation Note (Signed)
This note was copied from the chart of Boy Alvy BimlerSara Callow. Lactation Consultation Note  Patient Name: Boy Alvy BimlerSara Okubo ZOXWR'UToday's Date: 12/13/2015  [facility] was resting [facility] Left lactation handouts.    Maternal Data    Feeding    LATCH Score/Interventions                      Lactation Tools Discussed/Used     Consult Status      Rulon Eisenmengerlizabeth E Renn Stille 12/13/2015, 10:39 PM

## 2015-12-13 NOTE — Progress Notes (Signed)
8/C/vtx>>>AROM>>clr AF, stable FHR

## 2015-12-14 LAB — CBC
HEMATOCRIT: 29.8 % — AB (ref 36.0–46.0)
HEMOGLOBIN: 9.3 g/dL — AB (ref 12.0–15.0)
MCH: 25.2 pg — AB (ref 26.0–34.0)
MCHC: 31.2 g/dL (ref 30.0–36.0)
MCV: 80.8 fL (ref 78.0–100.0)
PLATELETS: 156 10*3/uL (ref 150–400)
RBC: 3.69 MIL/uL — AB (ref 3.87–5.11)
RDW: 14.9 % (ref 11.5–15.5)
WBC: 13.7 10*3/uL — AB (ref 4.0–10.5)

## 2015-12-14 MED ORDER — IBUPROFEN 800 MG PO TABS: 800.0000 mg | ORAL_TABLET | Freq: Three times a day (TID) | ORAL | Status: AC | PRN

## 2015-12-14 NOTE — Progress Notes (Signed)
Post Partum Day 1 Subjective: no complaints, up ad lib, voiding, tolerating PO, + flatus and does not desire circ  Objective: Blood pressure 104/56, pulse 100, temperature 98.1 F (36.7 C), temperature source Oral, resp. rate 20, height 5\' 8"  (1.727 m), weight 198 lb 3.2 oz (89.903 kg), SpO2 100 %, unknown if currently breastfeeding.  Physical Exam:  General: alert and cooperative Lochia: appropriate Uterine Fundus: firm Incision: healing well DVT Evaluation: No evidence of DVT seen on physical exam. Negative Homan's sign. No cords or calf tenderness. No significant calf/ankle edema.   Recent Labs  12/13/15 0631  HGB 12.0  HCT 37.4    Assessment/Plan: Discharge home   LOS: 1 day   Garold Sheeler G 12/14/2015, 7:56 AM

## 2015-12-14 NOTE — Lactation Note (Signed)
This note was copied from the chart of Boy Alvy BimlerSara Viegas. Lactation Consultation Note  Ex BF 9 months.  States she knows how to hand express. Baby latched in laid back cradle upon entering. Sucks and some swallows observed. Provided mother w/ hand pump. Mother denies questions or problems. Reviewed engorgement care and monitoring voids/stools.   Patient Name: Boy Alvy BimlerSara Barberi ZOXWR'UToday's Date: 12/14/2015 Reason for consult: Follow-up assessment   Maternal Data    Feeding Feeding Type: Breast Fed  LATCH Score/Interventions Latch: Grasps breast easily, tongue down, lips flanged, rhythmical sucking. (latched upon entering)  Audible Swallowing: A few with stimulation  Type of Nipple: Everted at rest and after stimulation  Comfort (Breast/Nipple): Soft / non-tender     Hold (Positioning): No assistance needed to correctly position infant at breast.  LATCH Score: 9  Lactation Tools Discussed/Used     Consult Status Consult Status: Complete    Hardie PulleyBerkelhammer, Salih Williamson Boschen 12/14/2015, 10:47 AM

## 2015-12-14 NOTE — Discharge Summary (Signed)
Obstetric Discharge Summary Reason for Admission: onset of labor Prenatal Procedures: ultrasound Intrapartum Procedures: spontaneous vaginal delivery Postpartum Procedures: none Complications-Operative and Postpartum: 2 degree perineal laceration HEMOGLOBIN  Date Value Ref Range Status  12/13/2015 12.0 12.0 - 15.0 g/dL Final   HCT  Date Value Ref Range Status  12/13/2015 37.4 36.0 - 46.0 % Final    Physical Exam:  General: alert and cooperative Lochia: appropriate Uterine Fundus: firm Incision: healing well DVT Evaluation: No evidence of DVT seen on physical exam. Negative Homan's sign. No cords or calf tenderness. No significant calf/ankle edema.  Discharge Diagnoses: Term Pregnancy-delivered  Discharge Information: Date: 12/14/2015 Activity: pelvic rest Diet: routine Medications: PNV and Ibuprofen Condition: stable Instructions: refer to practice specific booklet Discharge to: home   Newborn Data: Live born female  Birth Weight: 8 lb 7.8 oz (3850 g) APGAR: 9, 9  Home with mother.  Wynston Romey G 12/14/2015, 8:02 AM

## 2016-03-05 ENCOUNTER — Encounter: Payer: Self-pay | Admitting: Advanced Practice Midwife

## 2016-03-05 ENCOUNTER — Ambulatory Visit (INDEPENDENT_AMBULATORY_CARE_PROVIDER_SITE_OTHER): Payer: Medicaid Other | Admitting: Advanced Practice Midwife

## 2016-03-05 DIAGNOSIS — Z30430 Encounter for insertion of intrauterine contraceptive device: Secondary | ICD-10-CM | POA: Insufficient documentation

## 2016-03-05 DIAGNOSIS — O0933 Supervision of pregnancy with insufficient antenatal care, third trimester: Secondary | ICD-10-CM

## 2016-03-05 LAB — POCT PREGNANCY, URINE: PREG TEST UR: NEGATIVE

## 2016-03-05 NOTE — Progress Notes (Signed)
Chief Complaint:  No chief complaint on file.    HPI Samantha Ortiz is a 25 y.o. G2P2002 who presents to [facility] reporting Desire for Paraguard IUD insertion.  Discussed different types of IUDs and she does not want one with hormones in it.  Discussed that some women have increased bleeding and cramping with Paraguards.   She reports no vaginal bleeding, vaginal itching/burning, urinary symptoms, h/a, dizziness, n/v, or fever/chills.    Past Medical History: Past Medical History  Diagnosis Date  . Medical history non-contributory     Past obstetric history: OB History  Gravida Para Term Preterm AB SAB TAB Ectopic Multiple Living  0 0 0 0 0 0 2    # Outcome Date GA Lbr Len/2nd Weight Sex Delivery Anes PTL Lv  2 Term 12/13/15 [redacted]w[redacted]d 07:55 / 00:36 8 lb 7.8 oz (3.85 kg) M Ruffin Pyo  Y  1 Term 07/20/14 [redacted]w[redacted]d 11:35 / 05:18 8 lb 9.2 oz (3.89 kg) M Vag-Vacuum EPI  Y      Past Surgical History: Past Surgical History  Procedure Laterality Date  . No past surgeries      Family History: History reviewed. No pertinent family history.  Social History: Social History  Substance Use Topics  . Smoking status: Never Smoker   . Smokeless tobacco: Never Used  . Alcohol Use: No    Allergies: No Known Allergies  Meds:  (Not in a hospital admission)  I have reviewed patient's Past Medical Hx, Surgical Hx, Family Hx, Social Hx, medications and allergies.  ROS:  Review of Systems  Constitutional: Negative for fever, chills and fatigue.  Gastrointestinal: Negative for nausea, vomiting, abdominal pain, diarrhea and constipation.  Genitourinary: Negative for dysuria, vaginal bleeding, vaginal discharge and pelvic pain.  Musculoskeletal: Negative for back pain.  Neurological: Negative for dizziness.   Other systems negative  Physical Exam   Constitutional: Well-developed, well-nourished female in no acute distress.  Cardiovascular: normal rate and rhythm, no ectopy  audible, S1 & S2 heard, no murmur Respiratory: normal effort, no distress. Lungs CTAB with no wheezes or crackles GI: Abd soft, non-tender.  Nondistended.  No rebound, No guarding.  Bowel Sounds audible  MS: Extremities nontender, no edema, normal ROM Neurologic: Alert and oriented x 4.   Grossly nonfocal. GU: Neg CVAT. Skin:  Warm and Dry Psych:  Affect appropriate.  PELVIC EXAM: Cervix pink, visually closed, without lesion, scant white creamy discharge, vaginal walls and external genitalia normal Bimanual exam: Cervix firm, anterior, neg CMT, uterus nontender, nonenlarged, adnexa without tenderness, enlargement, or mass    Labs: Results for orders placed or performed in visit on 03/05/16 (from the past 24 hour(s))  Pregnancy, urine POC     Status: None   Collection Time: 03/05/16  2:04 PM  Result Value Ref Range   Preg Test, Ur NEGATIVE NEGATIVE   --/--/A POS (01/10 0454)  Imaging:  No results found.  MAU Course/MDM: I have ordered labs as follows: Imaging ordered:  Results reviewed.   Consult .   Treatments in MAU included .   Pt stable at time of discharge.  Assessment: 1. Insufficient prenatal care in third trimester   2. Encounter for IUD insertion     Plan: Discharge home Recommend  Rx sent for  for     Medication List       This list is accurate as of: 03/05/16  2:56 PM.  Always use your most recent med list.  ibuprofen 800 MG tablet  Commonly known as:  ADVIL,MOTRIN  Take 1 tablet (800 mg total) by mouth every 8 (eight) hours as needed for moderate pain.       Encouraged to return here or to other Urgent Care/ED if she develops worsening of symptoms, increase in pain, fever, or other concerning symptoms.   Wynelle BourgeoisMarie Adric Wrede CNM, MSN Certified Nurse-Midwife 03/05/2016 2:56 PM

## 2016-03-05 NOTE — Patient Instructions (Signed)
Intrauterine Device Insertion, Care After Refer to this sheet in the next few weeks. These instructions provide you with information on caring for yourself after your procedure. Your health care provider may also give you more specific instructions. Your treatment has been planned according to current medical practices, but problems sometimes occur. Call your health care provider if you have any problems or questions after your procedure. WHAT TO EXPECT AFTER THE PROCEDURE Insertion of the IUD may cause some discomfort, such as cramping. The cramping should improve after the IUD is in place. You may have bleeding after the procedure. This is normal. It varies from light spotting for a few days to menstrual-like bleeding. When the IUD is in place, a string will extend past the cervix into the vagina for 1-2 inches. The strings should not bother you or your partner. If they do, talk to your health care provider.  HOME CARE INSTRUCTIONS   Check your intrauterine device (IUD) to make sure it is in place before you resume sexual activity. You should be able to feel the strings. If you cannot feel the strings, something may be wrong. The IUD may have fallen out of the uterus, or the uterus may have been punctured (perforated) during placement. Also, if the strings are getting longer, it may mean that the IUD is being forced out of the uterus. You no longer have full protection from pregnancy if any of these problems occur.  You may resume sexual intercourse if you are not having problems with the IUD. The copper IUD is considered immediately effective, and the hormone IUD works right away if inserted within 7 days of your period starting. You will need to use a backup method of birth control for 7 days if the IUD in inserted at any other time in your cycle.  Continue to check that the IUD is still in place by feeling for the strings after every menstrual period.  You may need to take pain medicine such as  acetaminophen or ibuprofen. Only take medicines as directed by your health care provider. SEEK MEDICAL CARE IF:   You have bleeding that is heavier or lasts longer than a normal menstrual cycle.  You have a fever.  You have increasing cramps or abdominal pain not relieved with medicine.  You have abdominal pain that does not seem to be related to the same area of earlier cramping and pain.  You are lightheaded, unusually weak, or faint.  You have abnormal vaginal discharge or smells.  You have pain during sexual intercourse.  You cannot feel the IUD strings, or the IUD string has gotten longer.  You feel the IUD at the opening of the cervix in the vagina.  You think you are pregnant, or you miss your menstrual period.  The IUD string is hurting your sex partner. MAKE SURE YOU:  Understand these instructions.  Will watch your condition.  Will get help right away if you are not doing well or get worse.   This information is not intended to replace advice given to you by your health care provider. Make sure you discuss any questions you have with your health care provider.   Document Released: 07/18/2011 Document Revised: 09/09/2013 Document Reviewed: 05/10/2013 Elsevier Interactive Patient Education 2016 [employer]. Intrauterine Device Information An intrauterine device (IUD) is inserted into your uterus to prevent pregnancy. There are two types of IUDs available:   Copper IUD--This type of IUD is wrapped in copper wire and is placed inside the  uterus. Copper makes the uterus and fallopian tubes produce a fluid that kills sperm. The copper IUD can stay in place for 10 years.  Hormone IUD--This type of IUD contains the hormone progestin (synthetic progesterone). The hormone thickens the cervical mucus and prevents sperm from entering the uterus. It also thins the uterine lining to prevent implantation of a fertilized egg. The hormone can weaken or kill the sperm that get into  the uterus. One type of hormone IUD can stay in place for 5 years, and another type can stay in place for 3 years. Your health care provider will make sure you are a good candidate for a contraceptive IUD. Discuss with your health care provider the possible side effects.  ADVANTAGES OF AN INTRAUTERINE DEVICE  IUDs are highly effective, reversible, long acting, and low maintenance.   There are no estrogen-related side effects.   An IUD can be used when breastfeeding.   IUDs are not associated with weight gain.   The copper IUD works immediately after insertion.   The hormone IUD works right away if inserted within 7 days of your period starting. You will need to use a backup method of birth control for 7 days if the hormone IUD is inserted at any other time in your cycle.  The copper IUD does not interfere with your female hormones.   The hormone IUD can make heavy menstrual periods lighter and decrease cramping.   The hormone IUD can be used for 3 or 5 years.   The copper IUD can be used for 10 years. DISADVANTAGES OF AN INTRAUTERINE DEVICE  The hormone IUD can be associated with irregular bleeding patterns.   The copper IUD can make your menstrual flow heavier and more painful.   You may experience cramping and vaginal bleeding after insertion.    This information is not intended to replace advice given to you by your health care provider. Make sure you discuss any questions you have with your health care provider.   Document Released: 10/23/2004 Document Revised: 07/22/2013 Document Reviewed: 05/10/2013 Elsevier Interactive Patient Education [employer]2016 Elsevier Inc.

## 2016-03-12 ENCOUNTER — Inpatient Hospital Stay (HOSPITAL_COMMUNITY)
Admission: AD | Admit: 2016-03-12 | Discharge: 2016-03-12 | Disposition: A | Discharge status: Home / Self Care | Payer: Medicaid Other | Source: Ambulatory Visit | Attending: Family Medicine | Admitting: Family Medicine

## 2016-03-12 ENCOUNTER — Encounter (HOSPITAL_COMMUNITY): Payer: Self-pay | Admitting: *Deleted

## 2016-03-12 DIAGNOSIS — N61 Mastitis without abscess: Secondary | ICD-10-CM | POA: Insufficient documentation

## 2016-03-12 DIAGNOSIS — S20129A Blister (nonthermal) of breast, unspecified breast, initial encounter: Secondary | ICD-10-CM

## 2016-03-12 DIAGNOSIS — O9089 Other complications of the puerperium, not elsewhere classified: Secondary | ICD-10-CM | POA: Insufficient documentation

## 2016-03-12 DIAGNOSIS — N6459 Other signs and symptoms in breast: Secondary | ICD-10-CM | POA: Insufficient documentation

## 2016-03-12 DIAGNOSIS — Z79899 Other long term (current) drug therapy: Secondary | ICD-10-CM | POA: Insufficient documentation

## 2016-03-12 DIAGNOSIS — R238 Other skin changes: Secondary | ICD-10-CM | POA: Insufficient documentation

## 2016-03-12 DIAGNOSIS — O9229 Other disorders of breast associated with pregnancy and the puerperium: Secondary | ICD-10-CM

## 2016-03-12 MED ORDER — CEPHALEXIN 500 MG PO CAPS
500.0000 mg | ORAL_CAPSULE | Freq: Four times a day (QID) | ORAL | Status: DC
Start: 2016-03-12 — End: 2016-03-29

## 2016-03-12 MED ORDER — IBUPROFEN 600 MG PO TABS
600.0000 mg | ORAL_TABLET | Freq: Once | ORAL | Status: AC
  Administered 2016-03-12: 600 mg via ORAL
  Filled 2016-03-12: qty 1

## 2016-03-12 NOTE — MAU Note (Signed)
FH tracing from 1331 to 1333 is for pt [id number]

## 2016-03-12 NOTE — Discharge Instructions (Signed)
For more information on milk blisters:  [email or web address]   Breastfeeding and Mastitis Mastitis is inflammation of the breast tissue. It can occur in women who are breastfeeding. This can make breastfeeding painful. Mastitis will sometimes go away on its own. Your health care provider will help determine if treatment is needed. CAUSES Mastitis is often associated with a blocked milk (lactiferous) duct. This can happen when too much milk builds up in the breast. Causes of excess milk in the breast can include:  Poor latch-on. If your baby is not latched onto the breast properly, she or he may not empty your breast completely while breastfeeding.  Allowing too much time to pass between feedings.  Wearing a bra or other clothing that is too tight. This puts extra pressure on the lactiferous ducts so milk does not flow through them as it should. Mastitis can also be caused by a bacterial infection. Bacteria may enter the breast tissue through cuts or openings in the skin. In women who are breastfeeding, this may occur because of cracked or irritated skin. Cracks in the skin are often caused when your baby does not latch on properly to the breast. SIGNS AND SYMPTOMS  Swelling, redness, tenderness, and pain in an area of the breast.  Swelling of the glands under the arm on the same side.  Fever may or may not accompany mastitis. If an infection is allowed to progress, a collection of pus (abscess) may develop. DIAGNOSIS  Your health care provider can usually diagnose mastitis based on your symptoms and a physical exam. Tests may be done to help confirm the diagnosis. These may include:  Removal of pus from the breast by applying pressure to the area. This pus can be examined in the lab to determine which bacteria are present. If an abscess has developed, the fluid in the abscess can be removed with a needle. This can also be used to confirm the diagnosis and determine  the bacteria present. In most cases, pus will not be present.  Blood tests to determine if your body is fighting a bacterial infection.  Mammogram or ultrasound tests to rule out other problems or diseases. TREATMENT  Mastitis that occurs with breastfeeding will sometimes go away on its own. Your health care provider may choose to wait 24 hours after first seeing you to decide whether a prescription medicine is needed. If your symptoms are worse after 24 hours, your health care provider will likely prescribe an antibiotic medicine to treat the mastitis. He or she will determine which bacteria are most likely causing the infection and will then select an appropriate antibiotic medicine. This is sometimes changed based on the results of tests performed to identify the bacteria, or if there is no response to the antibiotic medicine selected. Antibiotic medicines are usually given by mouth. You may also be given medicine for pain. HOME CARE INSTRUCTIONS  Only take over-the-counter or prescription medicines for pain, fever, or discomfort as directed by your health care provider.  If your health care provider prescribed an antibiotic medicine, take the medicine as directed. Make sure you finish it even if you start to feel better.  Do not wear a tight or underwire bra. Wear a soft, supportive bra.  Increase your fluid intake, especially if you have a fever.  Continue to empty the breast. Your health care provider can tell you whether this milk is safe for your infant or needs to be thrown out. You may be told to stop nursing until your  health care provider thinks it is safe for your baby. Use a breast pump if you are advised to stop nursing.  Keep your nipples clean and dry.  Empty the first breast completely before going to the other breast. If your baby is not emptying your breasts completely for some reason, use a breast pump to empty your breasts.  If you go back to work, pump your breasts while  at work to stay in time with your nursing schedule.  Avoid allowing your breasts to become overly filled with milk (engorged). SEEK MEDICAL CARE IF:  You have pus-like discharge from the breast.  Your symptoms do not improve with the treatment prescribed by your health care provider within 2 days. SEEK IMMEDIATE MEDICAL CARE IF:  Your pain and swelling are getting worse.  You have pain that is not controlled with medicine.  You have a red line extending from the breast toward your armpit.  You have a fever or persistent symptoms for more than 2-3 days.  You have a fever and your symptoms suddenly get worse. MAKE SURE YOU:   Understand these instructions.  Will watch your condition.  Will get help right away if you are not doing well or get worse.   This information is not intended to replace advice given to you by your health care provider. Make sure you discuss any questions you have with your health care provider.   Document Released: 03/16/2005 Document Revised: 11/24/2013 Document Reviewed: 06/25/2013 Elsevier Interactive Patient Education [employer]2016 Elsevier Inc.

## 2016-03-12 NOTE — MAU Note (Addendum)
Has a fever, started yesterday.  Did not actually check it.  Feels cold.  Head feels heavy, is dizzy.  Pain in left breast (started a month ago). Getting worse.  Has been to lactation, was told every thing is ok.  Delivered Jan 09/2016, is breast feeding

## 2016-03-12 NOTE — MAU Provider Note (Signed)
Chief Complaint: No chief complaint on file.   First Provider Initiated Contact with Patient 03/12/16 1245      SUBJECTIVE HPI: Samantha Ortiz is a 25 y.o. G2P2002 lactating mother s/p NSVD on 12/13/15 who presents to maternity admissions reporting pain and redness in left breast, blisters on left nipple, and fever/chills.  She reports pain in left breast and blisters on left nipple x 3 weeks, with pain improving after nursing but blisters remaining.  Yesterday, she started having redness on her breast and felt feverish with chills. She had mastitis with her previous baby and feels that this is similar. She reports ibuprofen helps her pain when she takes it at home.  She reports she is breast and formula feeding and feeds the baby only formula at night. The last time the baby nursed from her affected breast was last night at 7 pm. She has a hand pump at home but has not pumped since last night. The baby did nurse from her right breast this morning before coming to MAU.                                            She denies vaginal bleeding, vaginal itching/burning, urinary symptoms, h/a, dizziness, or n/v.     HPI  Past Medical History  Diagnosis Date  . Medical history non-contributory    Past Surgical History  Procedure Laterality Date  . No past surgeries     Social History   Social History  . Marital Status: Married    Spouse Name: N/A  . Number of Children: N/A  . Years of Education: N/A   Occupational History  . Not on file.   Social History Main Topics  . Smoking status: Never Smoker   . Smokeless tobacco: Never Used  . Alcohol Use: No  . Drug Use: No  . Sexual Activity: Not Currently    Birth Control/ Protection: None   Other Topics Concern  . Not on file   Social History Narrative   No current facility-administered medications on file prior to encounter.   Current Outpatient Prescriptions on File Prior to Encounter  Medication Sig Dispense Refill  . ibuprofen  (ADVIL,MOTRIN) 800 MG tablet Take 1 tablet (800 mg total) by mouth every 8 (eight) hours as needed for moderate pain. 30 tablet 1   No Known Allergies  ROS:  Review of Systems  Constitutional: Positive for fever and chills. Negative for fatigue.  Respiratory: Negative for shortness of breath.   Cardiovascular: Negative for chest pain.  Genitourinary: Negative for dysuria, flank pain, vaginal bleeding, vaginal discharge, difficulty urinating, vaginal pain and pelvic pain.  Skin: Positive for color change.       Blisters on left nipple   Neurological: Negative for dizziness and headaches.  Psychiatric/Behavioral: Negative.      I have reviewed patient's Past Medical Hx, Surgical Hx, Family Hx, Social Hx, medications and allergies.   Physical Exam   Patient Vitals for the past 24 hrs:  BP Temp Temp src Pulse Resp SpO2 Weight  03/12/16 1507 116/57 mmHg - - 102 18 - -  03/12/16 1406 (!) 98/52 mmHg 98.3 F (36.8 C) Oral 94 20 - -  03/12/16 1130 - 98.3 F (36.8 C) Oral - - - -  03/12/16 1055 111/64 mmHg 99.4 F (37.4 C) Oral 94 16 100 % 180 lb 12.8 oz (82.01 kg)  03/12/16 1051 - - - - - 100 % -   Constitutional: Well-developed, well-nourished female in no acute distress.  Cardiovascular: normal rate Respiratory: normal effort Breast: Left breast with mild erythema and red streaking noted with skin warm to touch, left nipple with raised white blisters x 4-5 GI: Abd soft, non-tender. Pos BS x 4 MS: Extremities nontender, no edema, normal ROM Neurologic: Alert and oriented x 4.  GU: Neg CVAT.    LAB RESULTS No results found for this or any previous visit (from the past 24 hour(s)).  --/--/A POS (01/10 0631)  IMAGING No results found.  MAU Management/MDM: Clinical diagnosis of mastitis and milk blisters of nipple. Discussed breastfeeding with pt, recommended frequent emptying of breast to prevent milk stasis and infection.  Lactation consult called, and LC to room in MAU to  evaluate.  Pt education done and plan for pt to breastfeed more frequently and obtain a double electric pump via WIC.  Pt pumped while in MAU with several ounces of milk produced and pt pain relieved.  Will treat for mastitis with Keflex 500 mg QID x 10 days.  Ibuprofen PRN for pain/fever.  Pt stable at time of discharge.  ASSESSMENT 1. Milk bleb, postpartum   2. Mastitis, left, acute     PLAN Discharge home    Medication List    TAKE these medications        cephALEXin 500 MG capsule  Commonly known as:  KEFLEX  Take 1 capsule (500 mg total) by mouth 4 (four) times daily.     ibuprofen 800 MG tablet  Commonly known as:  ADVIL,MOTRIN  Take 1 tablet (800 mg total) by mouth every 8 (eight) hours as needed for moderate pain.       Follow-up Information    Please follow up.   Why:  Return to MAU as needed for emergencies      Schedule an appointment as soon as possible for a visit with [facility].   Specialty:  Lactation   Contact information:   [address] [id number] [place] [address] [phone]      Sharen Counter Certified Nurse-Midwife 03/12/2016  6:15 PM

## 2016-03-12 NOTE — Lactation Note (Signed)
Lactation Consultation Note  Patient Name: Samantha BimlerSara Ortiz HQION'GToday's Date: 03/12/2016 Reason for consult: Follow-up assessment;Other (Comment) (mom came  into MAU with mastitis)   Of thei mom and 293 month old baby, at home during consult. Mom was advised to feed formula at baby's bedtime, so he would sleep through the night. Mom has not been breastfeeding at night, or pumping. She came in today, having not fed or pumped in 7 hours, with very full, tender breasts. DEP was set up, and mom pumped abundant amount of milk. Her breasts are softer, but she has mastitis of her left breast, antibiotics ordered by midwife. Mom is very tender on left breast, with red streaks above her areola at approximately 11 -1 o'clock, or larger.  I spoke to mom, and she agreed to go back to exclusive breastfeeding. For now, she has a very sore nipple on the left, so the plan is to breast fed on right, manually pump on left, and feed EBM as needed. I advised mom to be sure to add probiotics while taking antibiotics, to avoid a yeast infection. [name] and symptoms of breast fungal infection reviewed with mom.  Mom is on Medicaid, but not WIC. I advised mom to call WIC, and apply, and see if she can get a DEP from them. Mom knows to call lactation as needed, for questions/concerns.    Maternal Data    Feeding    LATCH Score/Interventions          Comfort (Breast/Nipple): Engorged, cracked, bleeding, large blisters, severe discomfort Problem noted: Engorgment Intervention(s): Other (comment) (mom very tender , red streaks at 11 - 1 0'clock on breast above areola)           Lactation Tools Discussed/Used Initiated by:: bedside RN Date initiated:: 03/12/16   Consult Status Consult Status: Complete Follow-up type: Call as needed    Alfred LevinsLee, Login [place] 03/12/2016, 3:53 PM

## 2016-03-14 ENCOUNTER — Ambulatory Visit (HOSPITAL_COMMUNITY): Payer: Medicaid Other

## 2016-03-29 ENCOUNTER — Encounter: Payer: Self-pay | Admitting: Obstetrics and Gynecology

## 2016-03-29 ENCOUNTER — Ambulatory Visit (INDEPENDENT_AMBULATORY_CARE_PROVIDER_SITE_OTHER): Payer: Medicaid Other | Admitting: Obstetrics and Gynecology

## 2016-03-29 VITALS — Ht 68.0 in | Wt 176.2 lb

## 2016-03-29 DIAGNOSIS — Z30431 Encounter for routine checking of intrauterine contraceptive device: Secondary | ICD-10-CM | POA: Diagnosis not present

## 2016-03-29 NOTE — Progress Notes (Signed)
Patient ID: Samantha BimlerSara Ortiz, female   DOB: 11/11/1991, 25 y.o.   MRN: [id number] 25 yo G2P2 presenting today for IUD check. Patient had a Paraguard IUD inserted on 03/05/2016. She reports feeling well since insertion. She has been sexually active without complaints. She denies any pelvic/abdominal pain  Past Medical History  Diagnosis Date  . Medical history non-contributory    Past Surgical History  Procedure Laterality Date  . No past surgeries     No family history on file. Social History  Substance Use Topics  . Smoking status: Never Smoker   . Smokeless tobacco: Never Used  . Alcohol Use: No   ROS See pertinent in HPI  Height 5\' 8"  (1.727 m), weight 176 lb 3.2 oz (79.924 kg), last menstrual period 02/26/2016, unknown if currently breastfeeding. GENERAL: Well-developed, well-nourished female in no acute distress.  ABDOMEN: Soft, nontender, nondistended.  PELVIC: Normal external female genitalia. Vagina is pink and rugated.  Normal discharge. Normal appearing cervix with IUD strings extending 2.5 cm from the os. Uterus is normal in size. No adnexal mass or tenderness. EXTREMITIES: No cyanosis, clubbing, or edema, 2+ distal pulses.  A/P 25 yo G2P2 here for IUD check - IUD appears to be in the appropriate location - Advised to perform monthly self check - RTC prn

## 2016-04-06 ENCOUNTER — Telehealth (HOSPITAL_COMMUNITY): Payer: Self-pay | Admitting: Lactation Services

## 2016-04-06 NOTE — Telephone Encounter (Signed)
Husband, Ahamed, called on behalf of wife. He reports that she has chills, aches, & a fever of 100.6. She has had the fever since last night, despite pumping/feeding baby q2h. Dad encouraged to call OB.   This is patient's 2nd bout of probable mastitis in the last month. Husband reports that she also sometimes gets nipple blebs. Husband encouraged to have wife make an appt w/Lactation for breast management purposes. Appt made for Tues, May 9th at 1pm.  Glenetta HewKim Tanasia Budzinski, RN, [facility]

## 2016-04-10 ENCOUNTER — Telehealth (HOSPITAL_COMMUNITY): Payer: Self-pay | Admitting: Lactation Services

## 2016-04-10 ENCOUNTER — Ambulatory Visit (HOSPITAL_COMMUNITY): Admission: RE | Admit: 2016-04-10 | Payer: Medicaid Other | Source: Ambulatory Visit

## 2016-04-10 NOTE — Telephone Encounter (Signed)
Mom sick and needs to re-schedule her OP appointment for today. Appointment rescheduled for Tuesday, 04/17/16 at 4:00 pm.

## 2016-04-17 ENCOUNTER — Ambulatory Visit (HOSPITAL_COMMUNITY)
Admission: RE | Admit: 2016-04-17 | Discharge: 2016-04-17 | Disposition: A | Discharge status: Home / Self Care | Payer: Medicaid Other | Source: Ambulatory Visit | Attending: Obstetrics and Gynecology | Admitting: Obstetrics and Gynecology

## 2016-04-17 NOTE — Lactation Note (Signed)
Lactation Consult  Mother's reason for visit:  Follow-up for mastitis Visit Type:  OP Appointment Notes:  Samantha Ortiz was treated for mastitis. Her symptoms have resolved but she appears to have a low supply on the left breast. She has a bleb on that side. Encouraged her to pump for 10 minutes 4 times/24hr to increase supply. She responded that she was too busy to pump but would try.  It is noted that Samantha Ortiz does not maintain latch on the breast,especially on the left breast. He was congested so it is probably a combination of low supply and needing to breathe. 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  Consult:  Initial Lactation Consultant:  Samantha Ortiz, Samantha Ortiz  ________________________________________________________________________    __________________________________ Samantha FloresBaby's Name: Samantha Ortiz Date of Birth: [birth year]/13/2016 Pediatrician:Samantha Ortiz Gender: female Gestational Age: 2242w1d (At Birth) Birth Weight: 8 lb 7.8 oz (3850 g) Weight at Discharge: Weight: 8 lb 4.6 oz (3760 g)Date of Discharge: [facility]14/2015 [facility]   12/13/15 1001 12/14/15 0109  Weight: 8 lb 7.8 oz (3850 g) 8 lb 4.6 oz (3760 g)    Weight today: 16#14.2 oz   ______________________________________  Mother's Name: Samantha Ortiz  Maternal Medical Conditions:  resolved mastitis Maternal Medications:  denies  ________________________________________________________________________  Breastfeeding History (Post Discharge)  Frequency of breastfeeding:  5-6 times in 24 hours Duration of feeding:  15 minutes  Voids:  12 in 24 hrs.  Color:   Stools:  2 in 24 hrs.  Color:    ________________________________________________________________________  Maternal Breast Assessment  Breast:  Soft Nipple:  Erect and bleb Pain level:  0 Pain interventions:  na  _______________________________________________________________________

## 2018-12-03 NOTE — L&D Delivery Note (Signed)
Delivery Note At 3:26 AM a viable female was delivered via Vaginal, Spontaneous (Presentation: ROA).  APGAR: 9, 9; weight pending.   Placenta status: S, I. 3V Cord with the following complications: none.  Cord pH: n/a  Anesthesia:  None Episiotomy: None Lacerations: None Suture Repair: n/a Est. Blood Loss (mL):  150  Mom to postpartum.  Baby to Couplet care / Skin to Skin.  Linda Hedges 07/31/2019, 3:41 AM

## 2018-12-26 LAB — OB RESULTS CONSOLE GC/CHLAMYDIA
Chlamydia: NEGATIVE
Gonorrhea: NEGATIVE

## 2018-12-26 LAB — OB RESULTS CONSOLE HIV ANTIBODY (ROUTINE TESTING): HIV: NONREACTIVE

## 2018-12-26 LAB — OB RESULTS CONSOLE RPR: RPR: NONREACTIVE

## 2018-12-26 LAB — OB RESULTS CONSOLE HEPATITIS B SURFACE ANTIGEN: Hepatitis B Surface Ag: NEGATIVE

## 2018-12-26 LAB — OB RESULTS CONSOLE RUBELLA ANTIBODY, IGM: Rubella: IMMUNE

## 2018-12-26 LAB — OB RESULTS CONSOLE ANTIBODY SCREEN: Antibody Screen: NEGATIVE

## 2018-12-26 LAB — OB RESULTS CONSOLE ABO/RH: RH Type: POSITIVE

## 2019-07-21 ENCOUNTER — Telehealth (HOSPITAL_COMMUNITY): Payer: Self-pay | Admitting: *Deleted

## 2019-07-21 ENCOUNTER — Encounter (HOSPITAL_COMMUNITY): Payer: Self-pay | Admitting: *Deleted

## 2019-07-21 NOTE — Telephone Encounter (Signed)
Preadmission screen  

## 2019-07-22 ENCOUNTER — Telehealth (HOSPITAL_COMMUNITY): Payer: Self-pay | Admitting: *Deleted

## 2019-07-22 NOTE — Telephone Encounter (Signed)
Preadmission screen  

## 2019-07-23 ENCOUNTER — Telehealth (HOSPITAL_COMMUNITY): Payer: Self-pay | Admitting: *Deleted

## 2019-07-23 ENCOUNTER — Encounter (HOSPITAL_COMMUNITY): Payer: Self-pay | Admitting: *Deleted

## 2019-07-23 NOTE — Telephone Encounter (Signed)
Preadmission screen  

## 2019-07-29 ENCOUNTER — Other Ambulatory Visit (HOSPITAL_COMMUNITY)
Admission: RE | Admit: 2019-07-29 | Discharge: 2019-07-29 | Disposition: A | Discharge status: Home / Self Care | Payer: No Typology Code available for payment source | Source: Ambulatory Visit | Attending: Obstetrics and Gynecology | Admitting: Obstetrics and Gynecology

## 2019-07-29 ENCOUNTER — Other Ambulatory Visit: Payer: Self-pay

## 2019-07-29 DIAGNOSIS — Z20828 Contact with and (suspected) exposure to other viral communicable diseases: Secondary | ICD-10-CM | POA: Insufficient documentation

## 2019-07-29 DIAGNOSIS — Z01812 Encounter for preprocedural laboratory examination: Secondary | ICD-10-CM | POA: Diagnosis present

## 2019-07-29 LAB — SARS CORONAVIRUS 2 (TAT 6-24 HRS): SARS Coronavirus 2: NEGATIVE

## 2019-07-29 NOTE — MAU Note (Signed)
Covid swab collected. Pt tolerated well. Pt asymptomatic 

## 2019-07-31 ENCOUNTER — Encounter (HOSPITAL_COMMUNITY): Payer: Self-pay

## 2019-07-31 ENCOUNTER — Inpatient Hospital Stay (HOSPITAL_COMMUNITY)
Admission: AD | Admit: 2019-07-31 | Discharge: 2019-08-02 | DRG: 807 | Disposition: A | Discharge status: Home / Self Care | Payer: No Typology Code available for payment source | Attending: Obstetrics & Gynecology | Admitting: Obstetrics & Gynecology

## 2019-07-31 ENCOUNTER — Inpatient Hospital Stay (HOSPITAL_COMMUNITY): Payer: No Typology Code available for payment source

## 2019-07-31 ENCOUNTER — Other Ambulatory Visit: Payer: Self-pay

## 2019-07-31 DIAGNOSIS — Z3A40 40 weeks gestation of pregnancy: Secondary | ICD-10-CM

## 2019-07-31 DIAGNOSIS — Z3490 Encounter for supervision of normal pregnancy, unspecified, unspecified trimester: Secondary | ICD-10-CM

## 2019-07-31 DIAGNOSIS — O26893 Other specified pregnancy related conditions, third trimester: Secondary | ICD-10-CM | POA: Diagnosis present

## 2019-07-31 LAB — CBC
HCT: 39.3 % (ref 36.0–46.0)
HCT: 40.3 % (ref 36.0–46.0)
Hemoglobin: 12.6 g/dL (ref 12.0–15.0)
Hemoglobin: 12.9 g/dL (ref 12.0–15.0)
MCH: 27 pg (ref 26.0–34.0)
MCH: 26.3 pg (ref 26.0–34.0)
MCHC: 32.1 g/dL (ref 30.0–36.0)
MCHC: 32 g/dL (ref 30.0–36.0)
MCV: 84.2 fL (ref 80.0–100.0)
MCV: 82.1 fL (ref 80.0–100.0)
Platelets: 197 10*3/uL (ref 150–400)
Platelets: 177 10*3/uL (ref 150–400)
RBC: 4.67 MIL/uL (ref 3.87–5.11)
RBC: 4.91 MIL/uL (ref 3.87–5.11)
RDW: 15 % (ref 11.5–15.5)
RDW: 14.9 % (ref 11.5–15.5)
WBC: 16.6 10*3/uL — ABNORMAL HIGH (ref 4.0–10.5)
WBC: 20.8 10*3/uL — ABNORMAL HIGH (ref 4.0–10.5)
nRBC: 0 % (ref 0.0–0.2)
nRBC: 0 % (ref 0.0–0.2)

## 2019-07-31 LAB — TYPE AND SCREEN
ABO/RH(D): A POS
Antibody Screen: NEGATIVE

## 2019-07-31 MED ORDER — OXYTOCIN 10 UNIT/ML IJ SOLN
INTRAMUSCULAR | Status: AC
  Administered 2019-07-31: 04:00:00 10 [IU] via INTRAMUSCULAR
  Filled 2019-07-31: qty 1

## 2019-07-31 MED ORDER — SOD CITRATE-CITRIC ACID 500-334 MG/5ML PO SOLN: 30.0000 mL | ORAL | Status: DC | PRN

## 2019-07-31 MED ORDER — COCONUT OIL OIL: 1.0000 "application " | TOPICAL_OIL | Status: DC | PRN

## 2019-07-31 MED ORDER — OXYCODONE-ACETAMINOPHEN 5-325 MG PO TABS: 2.0000 | ORAL_TABLET | ORAL | Status: DC | PRN

## 2019-07-31 MED ORDER — OXYCODONE-ACETAMINOPHEN 5-325 MG PO TABS: 1.0000 | ORAL_TABLET | ORAL | Status: DC | PRN

## 2019-07-31 MED ORDER — ACETAMINOPHEN 325 MG PO TABS: 650.0000 mg | ORAL_TABLET | ORAL | Status: DC | PRN

## 2019-07-31 MED ORDER — PRENATAL MULTIVITAMIN CH
1.0000 | ORAL_TABLET | Freq: Every day | ORAL | Status: DC
  Administered 2019-07-31 – 2019-08-01 (×2): 1 via ORAL
  Filled 2019-07-31 (×2): qty 1

## 2019-07-31 MED ORDER — DIPHENHYDRAMINE HCL 25 MG PO CAPS: 25.0000 mg | ORAL_CAPSULE | Freq: Four times a day (QID) | ORAL | Status: DC | PRN

## 2019-07-31 MED ORDER — METHYLERGONOVINE MALEATE 0.2 MG/ML IJ SOLN
INTRAMUSCULAR | Status: AC
  Filled 2019-07-31: qty 1

## 2019-07-31 MED ORDER — FENTANYL CITRATE (PF) 100 MCG/2ML IJ SOLN: 50.0000 ug | INTRAMUSCULAR | Status: DC | PRN

## 2019-07-31 MED ORDER — LACTATED RINGERS IV SOLN: 500.0000 mL | INTRAVENOUS | Status: DC | PRN

## 2019-07-31 MED ORDER — DIBUCAINE (PERIANAL) 1 % EX OINT: 1.0000 "application " | TOPICAL_OINTMENT | CUTANEOUS | Status: DC | PRN

## 2019-07-31 MED ORDER — ONDANSETRON HCL 4 MG/2ML IJ SOLN: 4.0000 mg | INTRAMUSCULAR | Status: DC | PRN

## 2019-07-31 MED ORDER — METHYLERGONOVINE MALEATE 0.2 MG PO TABS: 0.2000 mg | ORAL_TABLET | ORAL | Status: DC | PRN

## 2019-07-31 MED ORDER — IBUPROFEN 600 MG PO TABS
600.0000 mg | ORAL_TABLET | Freq: Four times a day (QID) | ORAL | Status: DC
  Administered 2019-07-31 – 2019-08-02 (×8): 600 mg via ORAL
  Filled 2019-07-31 (×9): qty 1

## 2019-07-31 MED ORDER — SIMETHICONE 80 MG PO CHEW: 80.0000 mg | CHEWABLE_TABLET | ORAL | Status: DC | PRN

## 2019-07-31 MED ORDER — LACTATED RINGERS IV SOLN: INTRAVENOUS | Status: DC

## 2019-07-31 MED ORDER — ONDANSETRON HCL 4 MG PO TABS: 4.0000 mg | ORAL_TABLET | ORAL | Status: DC | PRN

## 2019-07-31 MED ORDER — BENZOCAINE-MENTHOL 20-0.5 % EX AERO: 1.0000 "application " | INHALATION_SPRAY | CUTANEOUS | Status: DC | PRN

## 2019-07-31 MED ORDER — ONDANSETRON HCL 4 MG/2ML IJ SOLN: 4.0000 mg | Freq: Four times a day (QID) | INTRAMUSCULAR | Status: DC | PRN

## 2019-07-31 MED ORDER — OXYTOCIN 40 UNITS IN NORMAL SALINE INFUSION - SIMPLE MED
2.5000 [IU]/h | INTRAVENOUS | Status: DC
  Filled 2019-07-31: qty 1000

## 2019-07-31 MED ORDER — ACETAMINOPHEN 325 MG PO TABS
650.0000 mg | ORAL_TABLET | ORAL | Status: DC | PRN
  Administered 2019-07-31: 650 mg via ORAL
  Filled 2019-07-31: qty 2

## 2019-07-31 MED ORDER — METHYLERGONOVINE MALEATE 0.2 MG/ML IJ SOLN
0.2000 mg | INTRAMUSCULAR | Status: DC | PRN
  Administered 2019-07-31: 0.2 mg via INTRAMUSCULAR

## 2019-07-31 MED ORDER — WITCH HAZEL-GLYCERIN EX PADS: 1.0000 "application " | MEDICATED_PAD | CUTANEOUS | Status: DC | PRN

## 2019-07-31 MED ORDER — TETANUS-DIPHTH-ACELL PERTUSSIS 5-2.5-18.5 LF-MCG/0.5 IM SUSP: 0.5000 mL | Freq: Once | INTRAMUSCULAR | Status: DC

## 2019-07-31 MED ORDER — SENNOSIDES-DOCUSATE SODIUM 8.6-50 MG PO TABS
2.0000 | ORAL_TABLET | ORAL | Status: DC
  Administered 2019-08-01: 2 via ORAL
  Filled 2019-07-31 (×2): qty 2

## 2019-07-31 MED ORDER — ZOLPIDEM TARTRATE 5 MG PO TABS: 5.0000 mg | ORAL_TABLET | Freq: Every evening | ORAL | Status: DC | PRN

## 2019-07-31 MED ORDER — FLEET ENEMA 7-19 GM/118ML RE ENEM: 1.0000 | ENEMA | RECTAL | Status: DC | PRN

## 2019-07-31 MED ORDER — LIDOCAINE HCL (PF) 1 % IJ SOLN: 30.0000 mL | INTRAMUSCULAR | Status: DC | PRN

## 2019-07-31 MED ORDER — OXYTOCIN BOLUS FROM INFUSION: 500.0000 mL | Freq: Once | INTRAVENOUS | Status: DC

## 2019-07-31 NOTE — MAU Note (Signed)
Contractions since 11 pm.  Every 5 min apart now. Bloody mucus.  No fluid leaking. Baby moving well.

## 2019-07-31 NOTE — H&P (Signed)
Samantha Ortiz is a 28 y.o. female G3P2002 at [redacted]w[redacted]d presenting for SOL at 11 pm tonight.  No LOF or VB.  Active FM.  No antepartum complications.  GBS negative.  OB History    Gravida  3   Para  2   Term  2   Preterm  0   AB  0   Living  2     SAB  0   TAB  0   Ectopic  0   Multiple  0   Live Births  2          Past Medical History:  Diagnosis Date  . Medical history non-contributory    Past Surgical History:  Procedure Laterality Date  . NO PAST SURGERIES     Family History: family history is not on file. Social History:  reports that she has never smoked. She has never used smokeless tobacco. She reports that she does not drink alcohol or use drugs.     Maternal Diabetes: No Genetic Screening: Normal Maternal Ultrasounds/Referrals: Normal Fetal Ultrasounds or other Referrals:  None Maternal Substance Abuse:  No Significant Maternal Medications:  None Significant Maternal Lab Results:  Group B Strep negative Other Comments:  None  ROS Maternal Medical History:  Reason for admission: Contractions.   Contractions: Onset was 3-5 hours ago.   Frequency: regular.   Perceived severity is strong.    Fetal activity: Perceived fetal activity is normal.   Last perceived fetal movement was within the past hour.    Prenatal complications: no prenatal complications Prenatal Complications - Diabetes: none.    Dilation: 8 Effacement (%): 80 Station: -2 Exam by:: benji Production designer, theatre/television/film Blood pressure (!) 125/58, pulse 84, temperature 98.5 F (36.9 C), temperature source Axillary, resp. rate 20, height 5\' 7"  (1.702 m), weight 87.1 kg, last menstrual period 10/18/2018, unknown if currently breastfeeding. Maternal Exam:  Uterine Assessment: Contraction strength is moderate.  Contraction frequency is regular.   Abdomen: Patient reports no abdominal tenderness. Fundal height is c/w dates.   Estimated fetal weight is 7#8.   Fetal presentation: vertex  Introitus:  Normal vulva. Pelvis: adequate for delivery.   Cervix: Cervix evaluated by digital exam.     Fetal Exam Fetal Monitor Review: Baseline rate: 140.  Variability: moderate (6-25 bpm).   Pattern: accelerations present and no decelerations.    Fetal State Assessment: Category I - tracings are normal.     Physical Exam  Constitutional: She is oriented to person, place, and time. She appears well-developed and well-nourished.  GI: Soft. There is no rebound and no guarding.  Genitourinary:    Vulva normal.   Neurological: She is alert and oriented to person, place, and time.  Skin: Skin is warm and dry.  Psychiatric: She has a normal mood and affect. Her behavior is normal.    Prenatal labs: ABO, Rh: --/--/A POS, A POS Performed at [facility], [place] [address] [place], [place] [address]  [phone]) Antibody: NEG (08/28 0125) Rubella: Immune (01/24 0000) RPR: Nonreactive (01/24 0000)  HBsAg: Negative (01/24 0000)  HIV: Non-reactive (01/24 0000)  GBS:   Negative  Assessment/Plan: 28yo G3P2002 at [redacted]w[redacted]d with SOL -Declines CLEA -Anticipate NSVD  Linda Hedges 07/31/2019, 2:54 AM

## 2019-07-31 NOTE — Lactation Note (Signed)
This note was copied from a baby's chart. Lactation Consultation Note  Patient Name: Samantha Ortiz PNTIR'W Date: 07/31/2019 Reason for consult: Initial assessment;Term P3.  Mom breastfed her previous babies for 12 months.  Reviewed hand expression.  Newborn is feeding well.  Mom c/o nipple tenderness.  Instructed to wait for wide open mouth and bring baby to breast quickly to obtain depth.  Reviewed colostrum and milk coming to volume in 3-5 days.  Questions answered.  Breastfeeding consultation services and support information given and reviewed.  Encouraged to call for assist prn.  Maternal Data Has patient been taught Hand Expression?: Yes Does the patient have breastfeeding experience prior to this delivery?: Yes  Feeding    LATCH Score                   Interventions Interventions: Breast feeding basics reviewed  Lactation Tools Discussed/Used     Consult Status Consult Status: Follow-up Date: 08/01/19 Follow-up type: In-patient    Ave Filter 07/31/2019, 3:02 PM

## 2019-08-01 LAB — ABO/RH: ABO/RH(D): A POS

## 2019-08-01 LAB — RPR: RPR Ser Ql: NONREACTIVE

## 2019-08-01 NOTE — Progress Notes (Signed)
Post Partum Day 1 Subjective: no complaints, up ad lib, voiding and tolerating PO  Objective: Blood pressure 104/67, pulse 72, temperature 98.3 F (36.8 C), temperature source Oral, resp. rate 16, height 5\' 7"  (1.702 m), weight 87.1 kg, last menstrual period 10/18/2018, SpO2 100 %, unknown if currently breastfeeding.  Physical Exam:  General: alert, cooperative and no distress Lochia: appropriate Uterine Fundus: firm Incision: healing well DVT Evaluation: No evidence of DVT seen on physical exam.  Recent Labs    07/31/19 0125 07/31/19 0617  HGB 12.6 12.9  HCT 39.3 40.3    Assessment/Plan: Plan for discharge tomorrow and Breastfeeding  Wants circ done at outside clinic - shes made arangements    LOS: 1 day   Luz Lex 08/01/2019, 10:25 AM

## 2019-08-02 NOTE — Lactation Note (Signed)
This note was copied from a baby's chart. Lactation Consultation Note  Patient Name: Samantha Ortiz WIOMB'T Date: 08/02/2019   P3, 7 hour female infant, weight loss -5% LC entered room mom and infant asleep.   Maternal Data    Feeding Feeding Type: Bottle Fed - Formula Nipple Type: Slow - flow  LATCH Score Latch: Grasps breast easily, tongue down, lips flanged, rhythmical sucking.  Audible Swallowing: Spontaneous and intermittent  Type of Nipple: Everted at rest and after stimulation  Comfort (Breast/Nipple): Soft / non-tender  Hold (Positioning): Assistance needed to correctly position infant at breast and maintain latch.  LATCH Score: 9  Interventions    Lactation Tools Discussed/Used     Consult Status      Vicente Serene 08/02/2019, 1:51 AM

## 2019-08-02 NOTE — Discharge Summary (Signed)
Obstetric Discharge Summary Reason for Admission: onset of labor Prenatal Procedures: none Intrapartum Procedures: spontaneous vaginal delivery Postpartum Procedures: none Complications-Operative and Postpartum: none Hemoglobin  Date Value Ref Range Status  07/31/2019 12.9 12.0 - 15.0 g/dL Final   HCT  Date Value Ref Range Status  07/31/2019 40.3 36.0 - 46.0 % Final    Physical Exam:  General: alert, cooperative, appears stated age and no distress Lochia: appropriate Uterine Fundus: firm Incision: healing well DVT Evaluation: No evidence of DVT seen on physical exam.  Discharge Diagnoses: Term Pregnancy-delivered  Discharge Information: Date: 08/02/2019 Activity: pelvic rest Diet: routine Medications: PNV Condition: stable Instructions: refer to practice specific booklet Discharge to: home   Newborn Data: Live born female  Birth Weight: 8 lb 11.2 oz (3946 g) APGAR: 8, 9  Newborn Delivery   Birth date/time: 07/31/2019 03:26:00 Delivery type: Vaginal, Spontaneous      Home with mother.  Samantha Ortiz 08/02/2019, 10:01 AM

## 2019-08-02 NOTE — Lactation Note (Signed)
This note was copied from a baby's chart. Lactation Consultation Note  Patient Name: Samantha Ortiz SELTR'V Date: 08/02/2019   Mom is a P3 with an abundant milk supply. Her breasts are filling. When I first entered the room, infant's cheeks were noted to be not rounded and Mom's nipple was creased when infant released latch. Frequent swallows were noted, however.   Mom had mastitis 2 times each with her 2 previous children, which Mom said was due to her abundant milk supply.  I suspected that infant was purposely not allowing a relaxed gape/latch b/c of the increased milk volume. I placed Mom in a laid-back nursing position and helped her to latch "Samantha Ortiz." She was comfortable for the entire duration of the feeding & was pleased. Infant seemed relaxed & his cheeks were nicely rounded.   Mom reports a history of cracked, bleeding nipples with her previous children. I am hoping that this new position will be helpful to her. I also provided breast shells to help protect her nipples & explained how to wear them.  Mom says she knows how to get in touch with [place] post-discharge.  Matthias Hughs [facility] 08/02/2019, 8:29 AM

## 2019-12-16 ENCOUNTER — Ambulatory Visit: Payer: No Typology Code available for payment source | Attending: Internal Medicine

## 2019-12-16 DIAGNOSIS — Z20822 Contact with and (suspected) exposure to covid-19: Secondary | ICD-10-CM

## 2019-12-18 ENCOUNTER — Encounter: Payer: Self-pay | Admitting: *Deleted

## 2019-12-18 LAB — NOVEL CORONAVIRUS, NAA: SARS-CoV-2, NAA: DETECTED — AB
